# Patient Record
Sex: Male | Born: 1960 | Race: White | Hispanic: No | Marital: Married | Smoking: Never smoker
Health system: Southern US, Community
[De-identification: ages and names within clinical notes are randomized; demographics above are authoritative.]

## PROBLEM LIST (undated history)

## (undated) DIAGNOSIS — F4310 Post-traumatic stress disorder, unspecified: Secondary | ICD-10-CM

## (undated) DIAGNOSIS — E119 Type 2 diabetes mellitus without complications: Secondary | ICD-10-CM

## (undated) DIAGNOSIS — G4730 Sleep apnea, unspecified: Secondary | ICD-10-CM

## (undated) DIAGNOSIS — K635 Polyp of colon: Secondary | ICD-10-CM

## (undated) DIAGNOSIS — I10 Essential (primary) hypertension: Secondary | ICD-10-CM

## (undated) DIAGNOSIS — G8929 Other chronic pain: Secondary | ICD-10-CM

## (undated) DIAGNOSIS — R7303 Prediabetes: Secondary | ICD-10-CM

## (undated) DIAGNOSIS — R519 Headache, unspecified: Secondary | ICD-10-CM

## (undated) DIAGNOSIS — E7800 Pure hypercholesterolemia, unspecified: Secondary | ICD-10-CM

## (undated) DIAGNOSIS — F32A Depression, unspecified: Secondary | ICD-10-CM

## (undated) HISTORY — DX: Headache, unspecified: R51.9

## (undated) HISTORY — PX: LAPAROSCOPIC GASTRIC BANDING: SHX1100

## (undated) HISTORY — PX: CERVICAL FUSION: SHX112

## (undated) HISTORY — DX: Prediabetes: R73.03

## (undated) HISTORY — DX: Polyp of colon: K63.5

## (undated) HISTORY — PX: BACK SURGERY: SHX140

## (undated) HISTORY — DX: Post-traumatic stress disorder, unspecified: F43.10

## (undated) HISTORY — DX: Other chronic pain: G89.29

## (undated) HISTORY — PX: SHOULDER SURGERY: SHX246

---
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
## 2017-12-01 ENCOUNTER — Emergency Department
Admission: EM | Admit: 2017-12-01 | Discharge: 2017-12-01 | Disposition: A | Discharge status: Home / Self Care | Payer: Managed Care, Other (non HMO) | Attending: Emergency Medicine | Admitting: Emergency Medicine

## 2017-12-01 ENCOUNTER — Other Ambulatory Visit: Payer: Self-pay

## 2017-12-01 DIAGNOSIS — R509 Fever, unspecified: Secondary | ICD-10-CM | POA: Diagnosis not present

## 2017-12-01 DIAGNOSIS — N289 Disorder of kidney and ureter, unspecified: Secondary | ICD-10-CM | POA: Diagnosis not present

## 2017-12-01 DIAGNOSIS — I10 Essential (primary) hypertension: Secondary | ICD-10-CM | POA: Insufficient documentation

## 2017-12-01 DIAGNOSIS — R531 Weakness: Secondary | ICD-10-CM | POA: Diagnosis present

## 2017-12-01 HISTORY — DX: Pure hypercholesterolemia, unspecified: E78.00

## 2017-12-01 HISTORY — DX: Sleep apnea, unspecified: G47.30

## 2017-12-01 HISTORY — DX: Essential (primary) hypertension: I10

## 2017-12-01 LAB — URINALYSIS, COMPLETE (UACMP) WITH MICROSCOPIC
BACTERIA UA: NONE SEEN
Bilirubin Urine: NEGATIVE
Glucose, UA: NEGATIVE mg/dL
Ketones, ur: NEGATIVE mg/dL
LEUKOCYTES UA: NEGATIVE
Nitrite: NEGATIVE
PROTEIN: NEGATIVE mg/dL
SQUAMOUS EPITHELIAL / LPF: NONE SEEN (ref 0–5)
Specific Gravity, Urine: 1.013 (ref 1.005–1.030)
pH: 5 (ref 5.0–8.0)

## 2017-12-01 LAB — BASIC METABOLIC PANEL
Anion gap: 8 (ref 5–15)
BUN: 20 mg/dL (ref 6–20)
CO2: 26 mmol/L (ref 22–32)
Calcium: 9.3 mg/dL (ref 8.9–10.3)
Chloride: 101 mmol/L (ref 101–111)
Creatinine, Ser: 1.65 mg/dL — ABNORMAL HIGH (ref 0.61–1.24)
GFR calc Af Amer: 52 mL/min — ABNORMAL LOW (ref >60)
GFR calc non Af Amer: 45 mL/min — ABNORMAL LOW (ref >60)
GLUCOSE: 137 mg/dL — AB (ref 65–99)
POTASSIUM: 4.2 mmol/L (ref 3.5–5.1)
Sodium: 135 mmol/L (ref 135–145)

## 2017-12-01 LAB — CBC
HEMATOCRIT: 48.3 % (ref 40.0–52.0)
Hemoglobin: 16.6 g/dL (ref 13.0–18.0)
MCH: 28.9 pg (ref 26.0–34.0)
MCHC: 34.4 g/dL (ref 32.0–36.0)
MCV: 84.2 fL (ref 80.0–100.0)
Platelets: 118 10*3/uL — ABNORMAL LOW (ref 150–440)
RBC: 5.74 MIL/uL (ref 4.40–5.90)
RDW: 13.6 % (ref 11.5–14.5)
WBC: 4 10*3/uL (ref 3.8–10.6)

## 2017-12-01 LAB — TROPONIN I: Troponin I: <0.03 ng/mL (ref <0.03)

## 2017-12-01 MED ORDER — METHYLPREDNISOLONE SODIUM SUCC 125 MG IJ SOLR: 125.0000 mg | Freq: Once | INTRAMUSCULAR | Status: DC

## 2017-12-01 MED ORDER — SODIUM CHLORIDE 0.9 % IV BOLUS
1000.0000 mL | Freq: Once | INTRAVENOUS | Status: AC
Start: 2017-12-01 — End: 2017-12-01
  Administered 2017-12-01: 1000 mL via INTRAVENOUS

## 2017-12-01 NOTE — ED Notes (Signed)
Report from kate, rn.  

## 2017-12-01 NOTE — ED Provider Notes (Signed)
[facility] Emergency Department Provider Note  Time seen: 3:04 PM  I have reviewed the triage vital signs and the nursing notes.   HISTORY  Chief Complaint Hypotension    HPI Jesse Wyatt is a 57 y.o. male with a past medical history of hypertension, hyperlipidemia who presents to the emergency department for generalized weakness and subjective fever.  According to the patient for the past 1 week he has not been feeling well he reports generalized fatigue and weakness.  States last night he had a subjective fever and awoke sweating overnight.  Patient went to urgent care today for evaluation states he is also been experiencing a rash over his body very itchy consistent with poison oak/poison ivy.  Patient states a history of poison ivy/oak allergies.  She received a steroid injection at urgent care.  They state the patient had a low blood pressure as well.  They checked his blood pressure several times but it did not improve so they sent him to the emergency department for further evaluation.  Here the patient states generalized fatigue and weakness as his only symptom besides rash with itching.  States they have been doing a lot of yard work over the past 1 week and he has been bitten by several ticks as well.  Reports a subjective fever last night but did not measure his temperature.  Denies any nausea, vomiting, diarrhea, dysuria, hematuria, chest pain, cough/congestion or abdominal pain.  Largely negative review of systems.    Past Medical History:  Diagnosis Date  . High cholesterol   . Hypertension   . Sleep apnea     There are no active problems to display for this patient.   Past Surgical History:  Procedure Laterality Date  . LAPAROSCOPIC GASTRIC BANDING    . SHOULDER SURGERY      Prior to Admission medications   Not on File    Allergies  Allergen Reactions  . Poison Oak Extract     History reviewed. No pertinent family history.  Social  History Social History   Tobacco Use  . Smoking status: Never Smoker  Substance Use Topics  . Alcohol use: Never    Frequency: Never  . Drug use: Not on file    Review of Systems Constitutional: Subjective fever. Eyes: Negative for visual complaints ENT: Negative for recent illness/congestion Cardiovascular: Negative for chest pain. Respiratory: Negative for shortness of breath.  Negative for cough. Gastrointestinal: Negative for abdominal pain, vomiting and diarrhea. Genitourinary: Negative for urinary compaints Musculoskeletal: Negative for musculoskeletal complaints Skin: Negative for skin complaints  Neurological: Negative for headache All other ROS negative  ____________________________________________   PHYSICAL EXAM:  VITAL SIGNS: ED Triage Vitals  Enc Vitals Group     BP 12/01/17 1418 (!) 101/57     Pulse Rate 12/01/17 1418 68     Resp 12/01/17 1418 10     Temp 12/01/17 1418 99 F (37.2 C)     Temp Source 12/01/17 1418 Oral     SpO2 12/01/17 1418 100 %     Weight 12/01/17 1414 255 lb (115.7 kg)     Height 12/01/17 1414 5\' 10"  (1.778 m)     Head Circumference --      Peak Flow --      Pain Score 12/01/17 1414 0     Pain Loc --      Pain Edu? --      Excl. in [place]? --    Constitutional: Alert and oriented. Well  appearing and in no distress. Eyes: Normal exam ENT   Head: Normocephalic and atraumatic.   Mouth/Throat: Mucous membranes are moist. Cardiovascular: Normal rate, regular rhythm. No murmur Respiratory: Normal respiratory effort without tachypnea nor retractions. Breath sounds are clear Gastrointestinal: Soft and nontender. No distention.  Musculoskeletal: Nontender with normal range of motion in all extremities. No lower extremity tenderness or edema. Neurologic:  Normal speech and language. No gross focal neurologic deficits Skin:  Skin is warm, dry.  Patient does have a rash over his bilateral upper extremities minimally on the lower  extremities and somewhat on the abdomen and back consistent with poison ivy/poison oak.  Patient states the rash has been present for at least 1 week. Psychiatric: Mood and affect are normal.   ____________________________________________    EKG  EKG reviewed and interpreted by myself shows normal sinus rhythm at 69 bpm with a narrow QRS, normal axis, largely normal intervals, no concerning ST changes.  ____________________________________________   INITIAL IMPRESSION / ASSESSMENT AND PLAN / ED COURSE  Pertinent labs & imaging results that were available during my care of the patient were reviewed by me and considered in my medical decision making (see chart for details).  Patient presents to the emergency department for generalized fatigue weakness, rash consistent with poison ivy/oak and hypotension.  Differential is quite broad would include infectious etiology, metabolic/electrolyte abnormality, dehydration, tickborne illness.  We will check labs, IV hydrate.  Patient received IM steroids prior to emergency department.  Overall the patient appears well, blood pressure currently 96/58.  Clear lung sounds, nontender abdomen, patient does have a rash which does appear consistent with poison ivy/oak.  Does not appear consistent with tickborne illness.  Labs are largely within normal limits besides creatinine 1.65.  Patient denies any renal insufficiency at baseline but he has no old records for comparison.  Patient follows up at the [place].  Overall patient appears well blood pressure is improved currently 397 systolic.  Patient denies feeling weak or dizzy.  States he is feeling normal at this time.  I discussed with the patient significant oral hydration over the next several days and following up on Tuesday with his doctor at the [facility] for recheck of his kidney function.  Patient agreeable to this plan of care.    ____________________________________________   FINAL CLINICAL IMPRESSION(S) / ED  DIAGNOSES  Weakness Hypertension Fever Renal insufficiency   Harvest Dark, MD 12/01/17 [phone]

## 2017-12-01 NOTE — ED Triage Notes (Signed)
Pt arrives [facility] (from home though). States he has been working outside a lot recently, has poison oak to bilat arms and lower abd. Hasn't taken anything for it, [facility] gave a steroid shot. Pt has been c/o of dizziness and lightheadedness, states ok at this time. Denies pain. BP with EMS was 80/56. 95% on RA, HR 72. No distress noted upon arrival. Ambulatory from EMS stretcher to ED stretcher. Alert and oriented, speaking in complete sentences.

## 2017-12-01 NOTE — Discharge Instructions (Addendum)
As we discussed please drink plenty of fluids over the next several days.  Please follow-up with your doctor in 2 to 3 days for recheck of your kidney function.  Today's creatinine is 1.6.  Return to the emergency department if you feel dizzy weak, lightheaded or if you pass out.

## 2017-12-01 NOTE — ED Notes (Signed)
Pt states that his wife has been getting 4-5 ticks off each day once he is done working outside. States that none are engorged.

## 2018-12-20 ENCOUNTER — Encounter: Payer: Self-pay | Admitting: Internal Medicine

## 2018-12-20 ENCOUNTER — Ambulatory Visit: Payer: Managed Care, Other (non HMO) | Admitting: Internal Medicine

## 2018-12-20 ENCOUNTER — Other Ambulatory Visit: Payer: Self-pay

## 2018-12-20 DIAGNOSIS — K635 Polyp of colon: Secondary | ICD-10-CM | POA: Insufficient documentation

## 2018-12-20 DIAGNOSIS — F4310 Post-traumatic stress disorder, unspecified: Secondary | ICD-10-CM

## 2018-12-20 DIAGNOSIS — D126 Benign neoplasm of colon, unspecified: Secondary | ICD-10-CM

## 2018-12-20 DIAGNOSIS — I10 Essential (primary) hypertension: Secondary | ICD-10-CM | POA: Diagnosis not present

## 2018-12-20 DIAGNOSIS — G8929 Other chronic pain: Secondary | ICD-10-CM | POA: Insufficient documentation

## 2018-12-20 DIAGNOSIS — M549 Dorsalgia, unspecified: Secondary | ICD-10-CM

## 2018-12-20 DIAGNOSIS — E7800 Pure hypercholesterolemia, unspecified: Secondary | ICD-10-CM

## 2018-12-20 DIAGNOSIS — Z0000 Encounter for general adult medical examination without abnormal findings: Secondary | ICD-10-CM

## 2018-12-20 DIAGNOSIS — G4733 Obstructive sleep apnea (adult) (pediatric): Secondary | ICD-10-CM

## 2018-12-20 DIAGNOSIS — R7303 Prediabetes: Secondary | ICD-10-CM | POA: Insufficient documentation

## 2018-12-20 DIAGNOSIS — G4730 Sleep apnea, unspecified: Secondary | ICD-10-CM | POA: Insufficient documentation

## 2018-12-20 NOTE — Assessment & Plan Note (Signed)
Sleeps with CPAP nightly

## 2018-12-20 NOTE — Progress Notes (Signed)
Subjective:    Patient ID: Jesse Wyatt, male    DOB: 10/06/1960, 58 y.o.   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 Same run every week---[place] twice a week  HTN started after injury in [place] About 7 years ago Has done fine with the medication Blood work done at [place]  Also diagnosed with prediabetes A1c 6.6% from the [place] Also had hematuria on DOT urinalysis----is getting evaluation at the [facility] for this next week  PTSD--on escitalopram No hospitalization No longer seeing psychiatrist or counselor now No anger issues Mostly depression--but not persistent Not anhedonic  Current Outpatient Medications on File Prior to Visit  Medication Sig Dispense Refill   atorvastatin (LIPITOR) 20 MG tablet Take 20 mg by mouth at bedtime.     escitalopram (LEXAPRO) 20 MG tablet Take 20 mg by mouth daily.     ibuprofen (ADVIL,MOTRIN) 400 MG tablet Take 400 mg by mouth 3 (three) times daily as needed for headache or moderate pain.     lisinopril-hydrochlorothiazide (PRINZIDE,ZESTORETIC) 20-12.5 MG tablet Take 1 tablet by mouth daily.     Omega-3 1000 MG CAPS Take 1 capsule by mouth 2 (two) times daily.     sildenafil (VIAGRA) 100 MG tablet Take 100 mg by mouth daily as needed for erectile dysfunction.     No current facility-administered medications on file prior to visit.     Allergies  Allergen Reactions   Poison Oak Extract     Past Medical History:  Diagnosis Date   Chronic back pain    Chronic headaches    does have photosensitivity. Ibuprofen helps   Colon polyps    colonoscopy every 5 years   High cholesterol    Hypertension    PTSD (post-traumatic stress disorder)    Sleep apnea     Past Surgical  History:  Procedure Laterality Date   CERVICAL FUSION  ~2000   LAPAROSCOPIC GASTRIC BANDING  ~2010   didn't help   SHOULDER SURGERY      Family History  Problem Relation Age of Onset   Stroke Mother    Hypertension Mother    Diabetes Mother    COPD Father    Atrial fibrillation Father    Prostate cancer Brother     Social History   Socioeconomic History   Marital status: Married    Spouse name: Not on file   Number of children: 1   Years of education: Not on file   Highest education level: Not on file  Occupational History   Occupation: Truck driver    Comment: long distance  Scientist, product/process development strain: Not on file   Food insecurity    Worry: Not on file    Inability: Not on file   Transportation needs    Medical: Not on file    Non-medical: Not on file  Tobacco Use   Smoking status: Never Smoker   Smokeless tobacco: Never Used  Substance and Sexual Activity   Alcohol use: Never    Frequency: Never   Drug use: Not on file   Sexual activity: Not on file  Lifestyle   Physical activity    Days per week: Not on file    Minutes per session: Not on file  Stress: Not on file  Relationships   Social connections    Talks on phone: Not on file    Gets together: Not on file    Attends religious service: Not on file    Active member of club or organization: Not on file    Attends meetings of clubs or organizations: Not on file    Relationship status: Not on file   Intimate partner violence    Fear of current or ex partner: Not on file    Emotionally abused: Not on file    Physically abused: Not on file    Forced sexual activity: Not on file  Other Topics Concern   Not on file  Social History Narrative   1 son   Review of Systems  Constitutional: Negative for unexpected weight change.       Hasn't been able to exercise due to back pain Wears seat belt  HENT: Negative for dental problem, hearing loss and tinnitus.    Eyes: Negative for visual disturbance.       Acuity has gone down  Respiratory: Negative for cough, chest tightness and shortness of breath.   Cardiovascular: Negative for chest pain, palpitations and leg swelling.  Gastrointestinal: Negative for blood in stool and constipation.       No heartburn  Endocrine: Negative for polydipsia and polyuria.  Genitourinary:       Nocturia x 2 Libido is down  Musculoskeletal: Positive for back pain. Negative for joint swelling.       Back pain into hips and upper legs  Allergic/Immunologic: Negative for environmental allergies and immunocompromised state.  Neurological: Positive for headaches. Negative for dizziness, syncope and light-headedness.  Hematological: Negative for adenopathy. Does not bruise/bleed easily.  Psychiatric/Behavioral: Positive for dysphoric mood. Negative for sleep disturbance. The patient is nervous/anxious.        Restless legs --better after sleep       Objective:   Physical Exam  Constitutional: He appears well-developed. No distress.  HENT:  Head: Normocephalic and atraumatic.  Right Ear: External ear normal.  Left Ear: External ear normal.  Mouth/Throat: Oropharynx is clear and moist. No oropharyngeal exudate.  Eyes: Pupils are equal, round, and reactive to light. Conjunctivae are normal.  Cardiovascular: Normal rate, regular rhythm, normal heart sounds and intact distal pulses. Exam reveals no gallop.  No murmur heard. Respiratory: Effort normal and breath sounds normal. No respiratory distress. He has no wheezes. He has no rales.  GI: Soft. There is no abdominal tenderness.  Band port palpable in LUQ  Musculoskeletal:        General: No tenderness or edema.  Lymphadenopathy:    He has no cervical adenopathy.  Skin: No rash noted. No erythema.  Psychiatric: He has a normal mood and affect. His behavior is normal.           Assessment & Plan:

## 2018-12-20 NOTE — Assessment & Plan Note (Signed)
BP Readings from Last 3 Encounters:  12/20/18 110/70  12/01/17 108/63   Good control Labs at [facility]

## 2018-12-20 NOTE — Assessment & Plan Note (Signed)
Colon due soon at the [place] Not sure if he has had PSA---I suspect so He will check on Td Flu vaccine in the fall Discussed fitness

## 2018-12-20 NOTE — Assessment & Plan Note (Signed)
Doing okay now on the medication

## 2018-12-20 NOTE — Patient Instructions (Signed)
DASH Eating Plan  DASH stands for "Dietary Approaches to Stop Hypertension." The DASH eating plan is a healthy eating plan that has been shown to reduce high blood pressure (hypertension). It may also reduce your risk for type 2 diabetes, heart disease, and stroke. The DASH eating plan may also help with weight loss.  What are tips for following this plan?    General guidelines   Avoid eating more than 2,300 mg (milligrams) of salt (sodium) a day. If you have hypertension, you may need to reduce your sodium intake to 1,500 mg a day.   Limit alcohol intake to no more than 1 drink a day for nonpregnant women and 2 drinks a day for men. One drink equals 12 oz of beer, 5 oz of wine, or 1 oz of hard liquor.   Work with your health care provider to maintain a healthy body weight or to lose weight. Ask what an ideal weight is for you.   Get at least 30 minutes of exercise that causes your heart to beat faster (aerobic exercise) most days of the week. Activities may include walking, swimming, or biking.   Work with your health care provider or diet and nutrition specialist (dietitian) to adjust your eating plan to your individual calorie needs.  Reading food labels     Check food labels for the amount of sodium per serving. Choose foods with less than 5 percent of the Daily Value of sodium. Generally, foods with less than 300 mg of sodium per serving fit into this eating plan.   To find whole grains, look for the word "whole" as the first word in the ingredient list.  Shopping   Buy products labeled as "low-sodium" or "no salt added."   Buy fresh foods. Avoid canned foods and premade or frozen meals.  Cooking   Avoid adding salt when cooking. Use salt-free seasonings or herbs instead of table salt or sea salt. Check with your health care provider or pharmacist before using salt substitutes.   Do not fry foods. Cook foods using healthy methods such as baking, boiling, grilling, and broiling instead.   Cook with  heart-healthy oils, such as olive, canola, soybean, or sunflower oil.  Meal planning   Eat a balanced diet that includes:  ? 5 or more servings of fruits and vegetables each day. At each meal, try to fill half of your plate with fruits and vegetables.  ? Up to 6-8 servings of whole grains each day.  ? Less than 6 oz of lean meat, poultry, or fish each day. A 3-oz serving of meat is about the same size as a deck of cards. One egg equals 1 oz.  ? 2 servings of low-fat dairy each day.  ? A serving of nuts, seeds, or beans 5 times each week.  ? Heart-healthy fats. Healthy fats called Omega-3 fatty acids are found in foods such as flaxseeds and coldwater fish, like sardines, salmon, and mackerel.   Limit how much you eat of the following:  ? Canned or prepackaged foods.  ? Food that is high in trans fat, such as fried foods.  ? Food that is high in saturated fat, such as fatty meat.  ? Sweets, desserts, sugary drinks, and other foods with added sugar.  ? Full-fat dairy products.   Do not salt foods before eating.   Try to eat at least 2 vegetarian meals each week.   Eat more home-cooked food and less restaurant, buffet, and fast food.     When eating at a restaurant, ask that your food be prepared with less salt or no salt, if possible.  What foods are recommended?  The items listed may not be a complete list. Talk with your dietitian about what dietary choices are best for you.  Grains  Whole-grain or whole-wheat bread. Whole-grain or whole-wheat pasta. Brown rice. Oatmeal. Quinoa. Bulgur. Whole-grain and low-sodium cereals. Pita bread. Low-fat, low-sodium crackers. Whole-wheat flour tortillas.  Vegetables  Fresh or frozen vegetables (raw, steamed, roasted, or grilled). Low-sodium or reduced-sodium tomato and vegetable juice. Low-sodium or reduced-sodium tomato sauce and tomato paste. Low-sodium or reduced-sodium canned vegetables.  Fruits  All fresh, dried, or frozen fruit. Canned fruit in natural juice (without  added sugar).  Meat and other protein foods  Skinless chicken or turkey. Ground chicken or turkey. Pork with fat trimmed off. Fish and seafood. Egg whites. Dried beans, peas, or lentils. Unsalted nuts, nut butters, and seeds. Unsalted canned beans. Lean cuts of beef with fat trimmed off. Low-sodium, lean deli meat.  Dairy  Low-fat (1%) or fat-free (skim) milk. Fat-free, low-fat, or reduced-fat cheeses. Nonfat, low-sodium ricotta or cottage cheese. Low-fat or nonfat yogurt. Low-fat, low-sodium cheese.  Fats and oils  Soft margarine without trans fats. Vegetable oil. Low-fat, reduced-fat, or light mayonnaise and salad dressings (reduced-sodium). Canola, safflower, olive, soybean, and sunflower oils. Avocado.  Seasoning and other foods  Herbs. Spices. Seasoning mixes without salt. Unsalted popcorn and pretzels. Fat-free sweets.  What foods are not recommended?  The items listed may not be a complete list. Talk with your dietitian about what dietary choices are best for you.  Grains  Baked goods made with fat, such as croissants, muffins, or some breads. Dry pasta or rice meal packs.  Vegetables  Creamed or fried vegetables. Vegetables in a cheese sauce. Regular canned vegetables (not low-sodium or reduced-sodium). Regular canned tomato sauce and paste (not low-sodium or reduced-sodium). Regular tomato and vegetable juice (not low-sodium or reduced-sodium). Pickles. Olives.  Fruits  Canned fruit in a light or heavy syrup. Fried fruit. Fruit in cream or butter sauce.  Meat and other protein foods  Fatty cuts of meat. Ribs. Fried meat. Bacon. Sausage. Bologna and other processed lunch meats. Salami. Fatback. Hotdogs. Bratwurst. Salted nuts and seeds. Canned beans with added salt. Canned or smoked fish. Whole eggs or egg yolks. Chicken or turkey with skin.  Dairy  Whole or 2% milk, cream, and half-and-half. Whole or full-fat cream cheese. Whole-fat or sweetened yogurt. Full-fat cheese. Nondairy creamers. Whipped toppings.  Processed cheese and cheese spreads.  Fats and oils  Butter. Stick margarine. Lard. Shortening. Ghee. Bacon fat. Tropical oils, such as coconut, palm kernel, or palm oil.  Seasoning and other foods  Salted popcorn and pretzels. Onion salt, garlic salt, seasoned salt, table salt, and sea salt. Worcestershire sauce. Tartar sauce. Barbecue sauce. Teriyaki sauce. Soy sauce, including reduced-sodium. Steak sauce. Canned and packaged gravies. Fish sauce. Oyster sauce. Cocktail sauce. Horseradish that you find on the shelf. Ketchup. Mustard. Meat flavorings and tenderizers. Bouillon cubes. Hot sauce and Tabasco sauce. Premade or packaged marinades. Premade or packaged taco seasonings. Relishes. Regular salad dressings.  Where to find more information:   National Heart, Lung, and Blood Institute: www.nhlbi.nih.gov   American Heart Association: www.heart.org  Summary   The DASH eating plan is a healthy eating plan that has been shown to reduce high blood pressure (hypertension). It may also reduce your risk for type 2 diabetes, heart disease, and stroke.   With the   DASH eating plan, you should limit salt (sodium) intake to 2,300 mg a day. If you have hypertension, you may need to reduce your sodium intake to 1,500 mg a day.   When on the DASH eating plan, aim to eat more fresh fruits and vegetables, whole grains, lean proteins, low-fat dairy, and heart-healthy fats.   Work with your health care provider or diet and nutrition specialist (dietitian) to adjust your eating plan to your individual calorie needs.  This information is not intended to replace advice given to you by your health care provider. Make sure you discuss any questions you have with your health care provider.  Document Released: 06/15/2011 Document Revised: 06/19/2016 Document Reviewed: 06/19/2016  [employer]vier Interactive Patient Education  2019 [employer]

## 2019-11-13 ENCOUNTER — Other Ambulatory Visit: Payer: Self-pay | Admitting: Neurosurgery

## 2019-11-27 ENCOUNTER — Encounter (HOSPITAL_COMMUNITY): Payer: Self-pay

## 2019-11-27 NOTE — Progress Notes (Signed)
[place], [place] - [place] [phone] [place] [address] Phone: [phone] Fax: [phone]      Your procedure is scheduled on Dec 01, 2019.  Report to [facility] Main Entrance "A" at 06:30 A.M., and check in at the Admitting office.  Call this number if you have problems the morning of surgery:  [phone]  Call [phone] if you have any questions prior to your surgery date Monday-Friday 8am-4pm    Remember:  Do not eat or drink after midnight the night before your surgery    Take these medicines the morning of surgery with A SIP OF WATER : Excitalopram (Lexapro)  As of today, STOP taking any Aspirin (unless otherwise instructed by your surgeon) and Aspirin containing products, Aleve, Naproxen, Ibuprofen, Motrin, Advil, Goody's, BC's, all herbal medications, fish oil, and all vitamins.   WHAT DO I DO ABOUT MY DIABETES MEDICATION?   Marland Kitchen Do not take oral diabetes medicines (pills) the morning of surgery. DO NOT take Metformin (Glucophage) the morning of surgery.   HOW TO MANAGE YOUR DIABETES BEFORE AND AFTER SURGERY  Why is it important to control my blood sugar before and after surgery? . Improving blood sugar levels before and after surgery helps healing and can limit problems. . A way of improving blood sugar control is eating a healthy diet by: o  Eating less sugar and carbohydrates o  Increasing activity/exercise o  Talking with your doctor about reaching your blood sugar goals . High blood sugars (greater than 180 mg/dL) can raise your risk of infections and slow your recovery, so you will need to focus on controlling your diabetes during the weeks before surgery. . Make sure that the doctor who takes care of your diabetes knows about your planned surgery including the date and location.  How do I manage my blood sugar before surgery? . Check your blood sugar at least 4 times a  day, starting 2 days before surgery, to make sure that the level is not too high or low. . Check your blood sugar the morning of your surgery when you wake up and every 2 hours until you get to the Short Stay unit. o If your blood sugar is less than 70 mg/dL, you will need to treat for low blood sugar: - Do not take insulin. - Treat a low blood sugar (less than 70 mg/dL) with  cup of clear juice (cranberry or apple), 4 glucose tablets, OR glucose gel. - Recheck blood sugar in 15 minutes after treatment (to make sure it is greater than 70 mg/dL). If your blood sugar is not greater than 70 mg/dL on recheck, call [phone] for further instructions. . Report your blood sugar to the short stay nurse when you get to Short Stay.  . If you are admitted to the hospital after surgery: o Your blood sugar will be checked by the staff and you will probably be given insulin after surgery (instead of oral diabetes medicines) to make sure you have good blood sugar levels. o The goal for blood sugar control after surgery is 80-180 mg/dL.                      Do not wear jewelry.            Do not wear lotions, powders, perfumes/colognes, or deodorant.            Do not shave 48 hours prior to surgery.  Men may shave face and neck.            Do not bring valuables to the hospital.            [facility] is not responsible for any belongings or valuables.  Do NOT Smoke (Tobacco/Vapping) or drink Alcohol 24 hours prior to your procedure If you use a CPAP at night, you may bring all equipment for your overnight stay.   Contacts, glasses, dentures or bridgework may not be worn into surgery.      For patients admitted to the hospital, discharge time will be determined by your treatment team.   Patients discharged the day of surgery will not be allowed to drive home, and someone needs to stay with them for 24 hours.    Special instructions:   [facility]- Preparing For Surgery  Before surgery, you can  play an important role. Because skin is not sterile, your skin needs to be as free of germs as possible. You can reduce the number of germs on your skin by washing with CHG (chlorahexidine gluconate) Soap before surgery.  CHG is an antiseptic cleaner which kills germs and bonds with the skin to continue killing germs even after washing.    Oral Hygiene is also important to reduce your risk of infection.  Remember - BRUSH YOUR TEETH THE MORNING OF SURGERY WITH YOUR REGULAR TOOTHPASTE  Please do not use if you have an allergy to CHG or antibacterial soaps. If your skin becomes reddened/irritated stop using the CHG.  Do not shave (including legs and underarms) for at least 48 hours prior to first CHG shower. It is OK to shave your face.  Please follow these instructions carefully.   1. Shower the NIGHT BEFORE SURGERY and the MORNING OF SURGERY with CHG Soap.   2. If you chose to wash your hair, wash your hair first as usual with your normal shampoo.  3. After you shampoo, rinse your hair and body thoroughly to remove the shampoo.  4. Use CHG as you would any other liquid soap. You can apply CHG directly to the skin and wash gently with a scrungie or a clean washcloth.   5. Apply the CHG Soap to your body ONLY FROM THE NECK DOWN.  Do not use on open wounds or open sores. Avoid contact with your eyes, ears, mouth and genitals (private parts). Wash Face and genitals (private parts)  with your normal soap.   6. Wash thoroughly, paying special attention to the area where your surgery will be performed.  7. Thoroughly rinse your body with warm water from the neck down.  8. DO NOT shower/wash with your normal soap after using and rinsing off the CHG Soap.  9. Pat yourself dry with a CLEAN TOWEL.  10. Wear CLEAN PAJAMAS to bed the night before surgery, wear comfortable clothes the morning of surgery  11. Place CLEAN SHEETS on your bed the night of your first shower and DO NOT SLEEP WITH  PETS.   Day of Surgery:   Do not apply any deodorants/lotions.  Please wear clean clothes to the hospital/surgery center.   Remember to brush your teeth WITH YOUR REGULAR TOOTHPASTE.   Please read over the following fact sheets that you were given.

## 2019-11-28 ENCOUNTER — Encounter (HOSPITAL_COMMUNITY): Payer: Self-pay

## 2019-11-28 ENCOUNTER — Other Ambulatory Visit (HOSPITAL_COMMUNITY)
Admission: RE | Admit: 2019-11-28 | Discharge: 2019-11-28 | Disposition: A | Discharge status: Home / Self Care | Payer: No Typology Code available for payment source | Source: Ambulatory Visit | Attending: Neurosurgery | Admitting: Neurosurgery

## 2019-11-28 ENCOUNTER — Other Ambulatory Visit: Payer: Self-pay

## 2019-11-28 ENCOUNTER — Encounter (HOSPITAL_COMMUNITY)
Admission: RE | Admit: 2019-11-28 | Discharge: 2019-11-28 | Disposition: A | Discharge status: Home / Self Care | Payer: No Typology Code available for payment source | Source: Ambulatory Visit | Attending: Neurosurgery | Admitting: Neurosurgery

## 2019-11-28 DIAGNOSIS — M48061 Spinal stenosis, lumbar region without neurogenic claudication: Secondary | ICD-10-CM | POA: Insufficient documentation

## 2019-11-28 DIAGNOSIS — E119 Type 2 diabetes mellitus without complications: Secondary | ICD-10-CM | POA: Insufficient documentation

## 2019-11-28 DIAGNOSIS — Z7984 Long term (current) use of oral hypoglycemic drugs: Secondary | ICD-10-CM | POA: Insufficient documentation

## 2019-11-28 DIAGNOSIS — I4510 Unspecified right bundle-branch block: Secondary | ICD-10-CM | POA: Insufficient documentation

## 2019-11-28 DIAGNOSIS — I10 Essential (primary) hypertension: Secondary | ICD-10-CM | POA: Insufficient documentation

## 2019-11-28 DIAGNOSIS — G4730 Sleep apnea, unspecified: Secondary | ICD-10-CM | POA: Insufficient documentation

## 2019-11-28 DIAGNOSIS — Z01818 Encounter for other preprocedural examination: Secondary | ICD-10-CM | POA: Insufficient documentation

## 2019-11-28 DIAGNOSIS — Z20822 Contact with and (suspected) exposure to covid-19: Secondary | ICD-10-CM | POA: Insufficient documentation

## 2019-11-28 HISTORY — DX: Type 2 diabetes mellitus without complications: E11.9

## 2019-11-28 LAB — CBC
HCT: 46.6 % (ref 39.0–52.0)
Hemoglobin: 15.5 g/dL (ref 13.0–17.0)
MCH: 28.5 pg (ref 26.0–34.0)
MCHC: 33.3 g/dL (ref 30.0–36.0)
MCV: 85.8 fL (ref 80.0–100.0)
Platelets: 210 10*3/uL (ref 150–400)
RBC: 5.43 MIL/uL (ref 4.22–5.81)
RDW: 12.8 % (ref 11.5–15.5)
WBC: 7.8 10*3/uL (ref 4.0–10.5)
nRBC: 0 % (ref 0.0–0.2)

## 2019-11-28 LAB — BASIC METABOLIC PANEL
Anion gap: 10 (ref 5–15)
BUN: 17 mg/dL (ref 6–20)
CO2: 24 mmol/L (ref 22–32)
Calcium: 9.3 mg/dL (ref 8.9–10.3)
Chloride: 104 mmol/L (ref 98–111)
Creatinine, Ser: 1.05 mg/dL (ref 0.61–1.24)
GFR calc Af Amer: >60 mL/min (ref >60)
GFR calc non Af Amer: >60 mL/min (ref >60)
Glucose, Bld: 227 mg/dL — ABNORMAL HIGH (ref 70–99)
Potassium: 4.1 mmol/L (ref 3.5–5.1)
Sodium: 138 mmol/L (ref 135–145)

## 2019-11-28 LAB — SURGICAL PCR SCREEN
MRSA, PCR: NEGATIVE
Staphylococcus aureus: POSITIVE — AB

## 2019-11-28 LAB — TYPE AND SCREEN
ABO/RH(D): A POS
Antibody Screen: NEGATIVE

## 2019-11-28 LAB — GLUCOSE, CAPILLARY: Glucose-Capillary: 248 mg/dL — ABNORMAL HIGH (ref 70–99)

## 2019-11-28 LAB — HEMOGLOBIN A1C
Hgb A1c MFr Bld: 6.8 % — ABNORMAL HIGH (ref 4.8–5.6)
Mean Plasma Glucose: 148.46 mg/dL

## 2019-11-28 LAB — SARS CORONAVIRUS 2 (TAT 6-24 HRS): SARS Coronavirus 2: NEGATIVE

## 2019-11-28 LAB — ABO/RH: ABO/RH(D): A POS

## 2019-11-28 NOTE — Progress Notes (Signed)
Anesthesia Chart Review:  Case: [id number] Date/Time: 12/01/19 0815   Procedure: Lumbar 3-4 Lumbar 4-5 Posterior lumbar interbody fusion (N/A )   Anesthesia type: General   Pre-op diagnosis: Spinal stenosis, lumbar region   Location: [facility] OR ROOM 20 / [facility]   Surgeons: Ashok Pall, MD      DISCUSSION: Patient is a 59 year old male scheduled for the above procedure.  History includes never smoker, OSA, hypercholesterolemia, HTN, PTSD, chronic back pain, DM2, laparoscopic gastric banding (~ 2010), cervical fusion (~ 2000). BMI is consistent with morbid obesity.  11/28/19 presurgical COVID-19 test is in process.  Anesthesia team to evaluate on the day of surgery.   VS: BP 140/70   Pulse 68   Temp 37.1 C (Oral)   Resp 18   Ht 5\' 6"  (1.676 m)   Wt 120.6 kg   SpO2 98%   BMI 42.90 kg/m   PROVIDERS: Venia Carbon, MD is listed as PCP.  He reported also seen Dr. Theda Sers at the [facility].   LABS: Labs reviewed: Acceptable for surgery. (all labs ordered are listed, but only abnormal results are displayed)  Labs Reviewed  SURGICAL PCR SCREEN - Abnormal; Notable for the following components:      Result Value   Staphylococcus aureus POSITIVE (*)    All other components within normal limits  GLUCOSE, CAPILLARY - Abnormal; Notable for the following components:   Glucose-Capillary 248 (*)    All other components within normal limits  BASIC METABOLIC PANEL - Abnormal; Notable for the following components:   Glucose, Bld 227 (*)    All other components within normal limits  HEMOGLOBIN A1C - Abnormal; Notable for the following components:   Hgb A1c MFr Bld 6.8 (*)    All other components within normal limits  CBC  TYPE AND SCREEN    EKG: 11/28/19: Normal sinus rhythm Incomplete right bundle branch block Borderline ECG No old tracing to compare Confirmed by Jenkins Rouge [phone]) on 11/28/2019 12:27:25 PM  CV: N/A  Past Medical History:  Diagnosis Date  . Chronic back  pain   . Chronic headaches    does have photosensitivity. Ibuprofen helps  . Colon polyps    colonoscopy every 5 years  . Diabetes mellitus without complication ([place])   . High cholesterol   . Hypertension   . Pre-diabetes   . Prediabetes   . PTSD (post-traumatic stress disorder)   . Sleep apnea     Past Surgical History:  Procedure Laterality Date  . CERVICAL FUSION  ~2000  . LAPAROSCOPIC GASTRIC BANDING  ~2010   didn't help  . SHOULDER SURGERY      MEDICATIONS: . atorvastatin (LIPITOR) 20 MG tablet  . cholecalciferol (VITAMIN D3) 25 MCG (1000 UNIT) tablet  . escitalopram (LEXAPRO) 20 MG tablet  . ibuprofen (ADVIL,MOTRIN) 400 MG tablet  . lisinopril-hydrochlorothiazide (PRINZIDE,ZESTORETIC) 20-12.5 MG tablet  . metFORMIN (GLUCOPHAGE) 500 MG tablet  . Omega-3 1000 MG CAPS  . sildenafil (VIAGRA) 100 MG tablet   No current facility-administered medications for this encounter.    Myra Gianotti, PA-C Surgical Short Stay/Anesthesiology [facility] Phone [phone] [facility] Phone [phone] 11/28/2019 3:20 PM

## 2019-11-28 NOTE — Progress Notes (Addendum)
PCP:  Dr. Theda Sers, [place] in [place] Cardiologist:  Denies  EKG:  11/28/19 CXR:  N/A ECHO:  Denies Stress Test:  Denies Cardiac Cath:  denies  Fasting Blood Sugar-  Patient does not have a machine to check BG's.  His BG at PAT was 248.  Checks Blood Sugar__0_ times a day  Anesthesia Review:  Yes, borderline EKG.    Covid test 11/28/19  Patient denies shortness of breath, fever, cough, and chest pain at PAT appointment.  Patient verbalized understanding of instructions provided today at the PAT appointment.  Patient asked to review instructions at home and day of surgery.

## 2019-11-30 ENCOUNTER — Encounter (HOSPITAL_COMMUNITY): Payer: Self-pay | Admitting: Neurosurgery

## 2019-11-30 NOTE — Anesthesia Preprocedure Evaluation (Addendum)
Anesthesia Evaluation  Patient identified by MRN, date of birth, ID band Patient awake    Reviewed: Allergy & Precautions, NPO status , Patient's Chart, lab work & pertinent test results  History of Anesthesia Complications (+) DIFFICULT AIRWAY and history of anesthetic complications  Airway Mallampati: III  TM Distance: >3 FB Neck ROM: Limited    Dental no notable dental hx. (+) Teeth Intact, Caps   Pulmonary sleep apnea and Continuous Positive Airway Pressure Ventilation ,    Pulmonary exam normal breath sounds clear to auscultation       Cardiovascular hypertension, Pt. on medications Normal cardiovascular exam Rhythm:Regular Rate:Normal  EKG- NSR, incomplete RBBB pattern   Neuro/Psych  Headaches, PSYCHIATRIC DISORDERS Anxiety PTSD   GI/Hepatic negative GI ROS, Neg liver ROS,   Endo/Other  diabetes, Well Controlled, Type 2, Oral Hypoglycemic AgentsMorbid obesityHyperlipidemia  Renal/GU negative Renal ROS  negative genitourinary   Musculoskeletal Spinal stenosis- L3-5 Chronic LBP   Abdominal (+) + obese,   Peds  Hematology negative hematology ROS (+)   Anesthesia Other Findings   Reproductive/Obstetrics                            Anesthesia Physical Anesthesia Plan  ASA: III  Anesthesia Plan: General   Post-op Pain Management:    Induction: Intravenous  PONV Risk Score and Plan: 3 and Midazolam, Ondansetron, Dexamethasone and Treatment may vary due to age or medical condition  Airway Management Planned: Oral ETT and Video Laryngoscope Planned  Additional Equipment:   Intra-op Plan:   Post-operative Plan: Extubation in OR  Informed Consent: I have reviewed the patients History and Physical, chart, labs and discussed the procedure including the risks, benefits and alternatives for the proposed anesthesia with the patient or authorized representative who has indicated his/her  understanding and acceptance.     Dental advisory given  Plan Discussed with: CRNA and Surgeon  Anesthesia Plan Comments: (Hx/o difficult intubation years ago. No difficulty with recent back surgery ( Nov. 2020 ). Limited ROM cervical spine.)       Anesthesia Quick Evaluation

## 2019-12-01 ENCOUNTER — Encounter (HOSPITAL_COMMUNITY): Payer: Self-pay | Admitting: Neurosurgery

## 2019-12-01 ENCOUNTER — Inpatient Hospital Stay (HOSPITAL_COMMUNITY): Payer: No Typology Code available for payment source | Admitting: Vascular Surgery

## 2019-12-01 ENCOUNTER — Inpatient Hospital Stay (HOSPITAL_COMMUNITY)
Admission: RE | Admit: 2019-12-01 | Discharge: 2019-12-03 | DRG: 454 | Disposition: A | Discharge status: Home / Self Care | Payer: No Typology Code available for payment source | Attending: Neurosurgery | Admitting: Neurosurgery

## 2019-12-01 ENCOUNTER — Encounter (HOSPITAL_COMMUNITY)
Admission: RE | Disposition: A | Discharge status: Home / Self Care | Payer: Self-pay | Source: Home / Self Care | Attending: Neurosurgery

## 2019-12-01 ENCOUNTER — Inpatient Hospital Stay (HOSPITAL_COMMUNITY): Payer: No Typology Code available for payment source | Admitting: Anesthesiology

## 2019-12-01 ENCOUNTER — Other Ambulatory Visit: Payer: Self-pay

## 2019-12-01 ENCOUNTER — Inpatient Hospital Stay (HOSPITAL_COMMUNITY): Payer: No Typology Code available for payment source

## 2019-12-01 DIAGNOSIS — G9741 Accidental puncture or laceration of dura during a procedure: Secondary | ICD-10-CM | POA: Diagnosis not present

## 2019-12-01 DIAGNOSIS — M4310 Spondylolisthesis, site unspecified: Secondary | ICD-10-CM | POA: Diagnosis present

## 2019-12-01 DIAGNOSIS — M4316 Spondylolisthesis, lumbar region: Secondary | ICD-10-CM | POA: Diagnosis present

## 2019-12-01 DIAGNOSIS — E119 Type 2 diabetes mellitus without complications: Secondary | ICD-10-CM | POA: Diagnosis present

## 2019-12-01 DIAGNOSIS — M48061 Spinal stenosis, lumbar region without neurogenic claudication: Secondary | ICD-10-CM | POA: Diagnosis present

## 2019-12-01 DIAGNOSIS — I10 Essential (primary) hypertension: Secondary | ICD-10-CM | POA: Diagnosis present

## 2019-12-01 DIAGNOSIS — Z419 Encounter for procedure for purposes other than remedying health state, unspecified: Secondary | ICD-10-CM

## 2019-12-01 DIAGNOSIS — Z20822 Contact with and (suspected) exposure to covid-19: Secondary | ICD-10-CM | POA: Diagnosis present

## 2019-12-01 LAB — GLUCOSE, CAPILLARY
Glucose-Capillary: 134 mg/dL — ABNORMAL HIGH (ref 70–99)
Glucose-Capillary: 180 mg/dL — ABNORMAL HIGH (ref 70–99)

## 2019-12-01 SURGERY — POSTERIOR LUMBAR FUSION 2 LEVEL
Anesthesia: General

## 2019-12-01 MED ORDER — THROMBIN 20000 UNITS EX SOLR
CUTANEOUS | Status: AC
  Filled 2019-12-01: qty 20000

## 2019-12-01 MED ORDER — OXYCODONE HCL 5 MG PO TABS
10.0000 mg | ORAL_TABLET | ORAL | Status: DC | PRN
  Administered 2019-12-02 – 2019-12-03 (×4): 10 mg via ORAL
  Filled 2019-12-01 (×4): qty 2

## 2019-12-01 MED ORDER — DEXAMETHASONE SODIUM PHOSPHATE 10 MG/ML IJ SOLN
INTRAMUSCULAR | Status: AC
  Filled 2019-12-01: qty 1

## 2019-12-01 MED ORDER — LIDOCAINE 2% (20 MG/ML) 5 ML SYRINGE
INTRAMUSCULAR | Status: AC
  Filled 2019-12-01: qty 5

## 2019-12-01 MED ORDER — POTASSIUM CHLORIDE IN NACL 20-0.9 MEQ/L-% IV SOLN
INTRAVENOUS | Status: DC
  Filled 2019-12-01: qty 1000

## 2019-12-01 MED ORDER — CEFAZOLIN SODIUM-DEXTROSE 2-4 GM/100ML-% IV SOLN
2.0000 g | Freq: Three times a day (TID) | INTRAVENOUS | Status: AC
  Administered 2019-12-01 – 2019-12-02 (×2): 2 g via INTRAVENOUS
  Filled 2019-12-01 (×2): qty 100

## 2019-12-01 MED ORDER — OXYCODONE HCL 5 MG PO TABS: 5.0000 mg | ORAL_TABLET | ORAL | Status: DC | PRN

## 2019-12-01 MED ORDER — ROCURONIUM BROMIDE 10 MG/ML (PF) SYRINGE
PREFILLED_SYRINGE | INTRAVENOUS | Status: AC
  Filled 2019-12-01: qty 10

## 2019-12-01 MED ORDER — PROPOFOL 10 MG/ML IV BOLUS
INTRAVENOUS | Status: AC
  Filled 2019-12-01: qty 40

## 2019-12-01 MED ORDER — LISINOPRIL-HYDROCHLOROTHIAZIDE 20-12.5 MG PO TABS: 1.0000 | ORAL_TABLET | Freq: Every day | ORAL | Status: DC

## 2019-12-01 MED ORDER — ALBUMIN HUMAN 5 % IV SOLN: INTRAVENOUS | Status: DC | PRN

## 2019-12-01 MED ORDER — ZOLPIDEM TARTRATE 5 MG PO TABS: 5.0000 mg | ORAL_TABLET | Freq: Every evening | ORAL | Status: DC | PRN

## 2019-12-01 MED ORDER — 0.9 % SODIUM CHLORIDE (POUR BTL) OPTIME
TOPICAL | Status: DC | PRN
  Administered 2019-12-01 (×2): 1000 mL

## 2019-12-01 MED ORDER — OXYCODONE HCL ER 15 MG PO T12A
15.0000 mg | EXTENDED_RELEASE_TABLET | Freq: Two times a day (BID) | ORAL | Status: DC
  Administered 2019-12-01 – 2019-12-03 (×4): 15 mg via ORAL
  Filled 2019-12-01 (×4): qty 1

## 2019-12-01 MED ORDER — PROPOFOL 10 MG/ML IV BOLUS
INTRAVENOUS | Status: DC | PRN
  Administered 2019-12-01: 180 mg via INTRAVENOUS

## 2019-12-01 MED ORDER — ACETAMINOPHEN 500 MG PO TABS
1000.0000 mg | ORAL_TABLET | Freq: Four times a day (QID) | ORAL | Status: AC
  Administered 2019-12-01 – 2019-12-02 (×3): 1000 mg via ORAL
  Filled 2019-12-01 (×4): qty 2

## 2019-12-01 MED ORDER — THROMBIN 5000 UNITS EX SOLR
CUTANEOUS | Status: AC
  Filled 2019-12-01: qty 5000

## 2019-12-01 MED ORDER — BUPIVACAINE HCL (PF) 0.5 % IJ SOLN
INTRAMUSCULAR | Status: DC | PRN
  Administered 2019-12-01: 30 mL

## 2019-12-01 MED ORDER — SUCCINYLCHOLINE CHLORIDE 200 MG/10ML IV SOSY
PREFILLED_SYRINGE | INTRAVENOUS | Status: DC | PRN
  Administered 2019-12-01: 140 mg via INTRAVENOUS

## 2019-12-01 MED ORDER — MORPHINE SULFATE (PF) 2 MG/ML IV SOLN: 1.0000 mg | INTRAVENOUS | Status: DC | PRN

## 2019-12-01 MED ORDER — ROCURONIUM BROMIDE 10 MG/ML (PF) SYRINGE
PREFILLED_SYRINGE | INTRAVENOUS | Status: DC | PRN
  Administered 2019-12-01: 50 mg via INTRAVENOUS
  Administered 2019-12-01: 30 mg via INTRAVENOUS
  Administered 2019-12-01: 40 mg via INTRAVENOUS
  Administered 2019-12-01: 10 mg via INTRAVENOUS
  Administered 2019-12-01: 50 mg via INTRAVENOUS
  Administered 2019-12-01: 20 mg via INTRAVENOUS

## 2019-12-01 MED ORDER — HYDROMORPHONE HCL 1 MG/ML IJ SOLN
INTRAMUSCULAR | Status: AC
  Administered 2019-12-01: 0.5 mg
  Filled 2019-12-01: qty 1

## 2019-12-01 MED ORDER — SUGAMMADEX SODIUM 200 MG/2ML IV SOLN
INTRAVENOUS | Status: DC | PRN
  Administered 2019-12-01: 200 mg via INTRAVENOUS

## 2019-12-01 MED ORDER — SODIUM CHLORIDE 0.9 % IV SOLN
250.0000 mL | INTRAVENOUS | Status: DC
  Administered 2019-12-01: 250 mL via INTRAVENOUS

## 2019-12-01 MED ORDER — CHLORHEXIDINE GLUCONATE 0.12 % MT SOLN: 15.0000 mL | Freq: Once | OROMUCOSAL | Status: DC

## 2019-12-01 MED ORDER — PHENYLEPHRINE 40 MCG/ML (10ML) SYRINGE FOR IV PUSH (FOR BLOOD PRESSURE SUPPORT)
PREFILLED_SYRINGE | INTRAVENOUS | Status: DC | PRN
  Administered 2019-12-01 (×3): 80 ug via INTRAVENOUS

## 2019-12-01 MED ORDER — PHENYLEPHRINE HCL-NACL 10-0.9 MG/250ML-% IV SOLN
INTRAVENOUS | Status: DC | PRN
  Administered 2019-12-01: 20 ug/min via INTRAVENOUS

## 2019-12-01 MED ORDER — MIDAZOLAM HCL 2 MG/2ML IJ SOLN
INTRAMUSCULAR | Status: AC
  Filled 2019-12-01: qty 2

## 2019-12-01 MED ORDER — GLYCOPYRROLATE PF 0.2 MG/ML IJ SOSY
PREFILLED_SYRINGE | INTRAMUSCULAR | Status: AC
  Filled 2019-12-01: qty 1

## 2019-12-01 MED ORDER — BISACODYL 5 MG PO TBEC: 5.0000 mg | DELAYED_RELEASE_TABLET | Freq: Every day | ORAL | Status: DC | PRN

## 2019-12-01 MED ORDER — ACETAMINOPHEN 325 MG PO TABS: 650.0000 mg | ORAL_TABLET | ORAL | Status: DC | PRN

## 2019-12-01 MED ORDER — ESCITALOPRAM OXALATE 10 MG PO TABS
20.0000 mg | ORAL_TABLET | Freq: Every day | ORAL | Status: DC
  Administered 2019-12-01 – 2019-12-03 (×3): 20 mg via ORAL
  Filled 2019-12-01 (×3): qty 2

## 2019-12-01 MED ORDER — HYDROCHLOROTHIAZIDE 12.5 MG PO CAPS
12.5000 mg | ORAL_CAPSULE | Freq: Every day | ORAL | Status: DC
  Administered 2019-12-01 – 2019-12-03 (×2): 12.5 mg via ORAL
  Filled 2019-12-01 (×3): qty 1

## 2019-12-01 MED ORDER — LIDOCAINE-EPINEPHRINE 0.5 %-1:200000 IJ SOLN
INTRAMUSCULAR | Status: DC | PRN
  Administered 2019-12-01: 10 mL

## 2019-12-01 MED ORDER — CEFAZOLIN SODIUM-DEXTROSE 2-4 GM/100ML-% IV SOLN
2.0000 g | INTRAVENOUS | Status: AC
  Administered 2019-12-01 (×2): 2 g via INTRAVENOUS
  Filled 2019-12-01: qty 100

## 2019-12-01 MED ORDER — LIDOCAINE 2% (20 MG/ML) 5 ML SYRINGE
INTRAMUSCULAR | Status: DC | PRN
  Administered 2019-12-01: 80 mg via INTRAVENOUS

## 2019-12-01 MED ORDER — LISINOPRIL 20 MG PO TABS
20.0000 mg | ORAL_TABLET | Freq: Every day | ORAL | Status: DC
  Administered 2019-12-01 – 2019-12-03 (×2): 20 mg via ORAL
  Filled 2019-12-01 (×3): qty 1

## 2019-12-01 MED ORDER — ONDANSETRON HCL 4 MG/2ML IJ SOLN: 4.0000 mg | Freq: Four times a day (QID) | INTRAMUSCULAR | Status: DC | PRN

## 2019-12-01 MED ORDER — SENNOSIDES-DOCUSATE SODIUM 8.6-50 MG PO TABS: 1.0000 | ORAL_TABLET | Freq: Every evening | ORAL | Status: DC | PRN

## 2019-12-01 MED ORDER — BUPIVACAINE HCL (PF) 0.5 % IJ SOLN
INTRAMUSCULAR | Status: AC
  Filled 2019-12-01: qty 30

## 2019-12-01 MED ORDER — LIDOCAINE-EPINEPHRINE 0.5 %-1:200000 IJ SOLN
INTRAMUSCULAR | Status: AC
  Filled 2019-12-01: qty 1

## 2019-12-01 MED ORDER — DOCUSATE SODIUM 100 MG PO CAPS
100.0000 mg | ORAL_CAPSULE | Freq: Two times a day (BID) | ORAL | Status: DC
  Administered 2019-12-01 – 2019-12-03 (×4): 100 mg via ORAL
  Filled 2019-12-01 (×4): qty 1

## 2019-12-01 MED ORDER — PHENYLEPHRINE HCL (PRESSORS) 10 MG/ML IV SOLN
INTRAVENOUS | Status: AC
  Filled 2019-12-01: qty 1

## 2019-12-01 MED ORDER — SUCCINYLCHOLINE CHLORIDE 200 MG/10ML IV SOSY
PREFILLED_SYRINGE | INTRAVENOUS | Status: AC
  Filled 2019-12-01: qty 10

## 2019-12-01 MED ORDER — ATORVASTATIN CALCIUM 10 MG PO TABS
10.0000 mg | ORAL_TABLET | Freq: Every day | ORAL | Status: DC
  Administered 2019-12-01 – 2019-12-02 (×2): 10 mg via ORAL
  Filled 2019-12-01 (×2): qty 1

## 2019-12-01 MED ORDER — MEPERIDINE HCL 25 MG/ML IJ SOLN: 6.2500 mg | INTRAMUSCULAR | Status: DC | PRN

## 2019-12-01 MED ORDER — OMEGA-3-ACID ETHYL ESTERS 1 G PO CAPS
1.0000 g | ORAL_CAPSULE | Freq: Two times a day (BID) | ORAL | Status: DC
  Administered 2019-12-01 – 2019-12-03 (×4): 1 g via ORAL
  Filled 2019-12-01 (×4): qty 1

## 2019-12-01 MED ORDER — LACTATED RINGERS IV SOLN: INTRAVENOUS | Status: DC

## 2019-12-01 MED ORDER — VITAMIN D 25 MCG (1000 UNIT) PO TABS
1000.0000 [IU] | ORAL_TABLET | Freq: Every day | ORAL | Status: DC
  Administered 2019-12-02 – 2019-12-03 (×2): 1000 [IU] via ORAL
  Filled 2019-12-01 (×2): qty 1

## 2019-12-01 MED ORDER — FENTANYL CITRATE (PF) 250 MCG/5ML IJ SOLN
INTRAMUSCULAR | Status: AC
  Filled 2019-12-01: qty 5

## 2019-12-01 MED ORDER — MENTHOL 3 MG MT LOZG: 1.0000 | LOZENGE | OROMUCOSAL | Status: DC | PRN

## 2019-12-01 MED ORDER — PHENYLEPHRINE 40 MCG/ML (10ML) SYRINGE FOR IV PUSH (FOR BLOOD PRESSURE SUPPORT)
PREFILLED_SYRINGE | INTRAVENOUS | Status: AC
  Filled 2019-12-01: qty 10

## 2019-12-01 MED ORDER — ONDANSETRON HCL 4 MG/2ML IJ SOLN
INTRAMUSCULAR | Status: AC
  Filled 2019-12-01: qty 2

## 2019-12-01 MED ORDER — GLYCOPYRROLATE 0.2 MG/ML IJ SOLN
INTRAMUSCULAR | Status: DC | PRN
  Administered 2019-12-01: .2 mg via INTRAVENOUS

## 2019-12-01 MED ORDER — OMEGA-3 1000 MG PO CAPS: 1.0000 | ORAL_CAPSULE | Freq: Two times a day (BID) | ORAL | Status: DC

## 2019-12-01 MED ORDER — ONDANSETRON HCL 4 MG/2ML IJ SOLN
INTRAMUSCULAR | Status: DC | PRN
  Administered 2019-12-01: 4 mg via INTRAVENOUS

## 2019-12-01 MED ORDER — PHENOL 1.4 % MT LIQD: 1.0000 | OROMUCOSAL | Status: DC | PRN

## 2019-12-01 MED ORDER — ORAL CARE MOUTH RINSE: 15.0000 mL | Freq: Once | OROMUCOSAL | Status: AC

## 2019-12-01 MED ORDER — MIDAZOLAM HCL 2 MG/2ML IJ SOLN
INTRAMUSCULAR | Status: DC | PRN
  Administered 2019-12-01: 2 mg via INTRAVENOUS

## 2019-12-01 MED ORDER — ACETAMINOPHEN 650 MG RE SUPP: 650.0000 mg | RECTAL | Status: DC | PRN

## 2019-12-01 MED ORDER — HYDROMORPHONE HCL 1 MG/ML IJ SOLN: 0.2500 mg | INTRAMUSCULAR | Status: DC | PRN

## 2019-12-01 MED ORDER — MAGNESIUM CITRATE PO SOLN: 1.0000 | Freq: Once | ORAL | Status: DC | PRN

## 2019-12-01 MED ORDER — SODIUM CHLORIDE 0.9 % IV SOLN: INTRAVENOUS | Status: DC | PRN

## 2019-12-01 MED ORDER — DEXAMETHASONE SODIUM PHOSPHATE 10 MG/ML IJ SOLN
INTRAMUSCULAR | Status: DC | PRN
  Administered 2019-12-01: 10 mg via INTRAVENOUS

## 2019-12-01 MED ORDER — FENTANYL CITRATE (PF) 250 MCG/5ML IJ SOLN
INTRAMUSCULAR | Status: DC | PRN
  Administered 2019-12-01: 50 ug via INTRAVENOUS
  Administered 2019-12-01: 150 ug via INTRAVENOUS
  Administered 2019-12-01 (×3): 50 ug via INTRAVENOUS

## 2019-12-01 MED ORDER — METFORMIN HCL 500 MG PO TABS
500.0000 mg | ORAL_TABLET | Freq: Every day | ORAL | Status: DC
  Administered 2019-12-02 – 2019-12-03 (×2): 500 mg via ORAL
  Filled 2019-12-01 (×2): qty 1

## 2019-12-01 MED ORDER — SODIUM CHLORIDE 0.9% FLUSH: 3.0000 mL | INTRAVENOUS | Status: DC | PRN

## 2019-12-01 MED ORDER — ONDANSETRON HCL 4 MG/2ML IJ SOLN: 4.0000 mg | Freq: Once | INTRAMUSCULAR | Status: DC | PRN

## 2019-12-01 MED ORDER — ONDANSETRON HCL 4 MG PO TABS
4.0000 mg | ORAL_TABLET | Freq: Four times a day (QID) | ORAL | Status: DC | PRN
  Administered 2019-12-03: 4 mg via ORAL
  Filled 2019-12-01: qty 1

## 2019-12-01 MED ORDER — CHLORHEXIDINE GLUCONATE CLOTH 2 % EX PADS: 6.0000 | MEDICATED_PAD | Freq: Once | CUTANEOUS | Status: DC

## 2019-12-01 MED ORDER — DIAZEPAM 5 MG PO TABS
5.0000 mg | ORAL_TABLET | Freq: Four times a day (QID) | ORAL | Status: DC | PRN
  Administered 2019-12-02 (×2): 5 mg via ORAL
  Filled 2019-12-01 (×2): qty 1

## 2019-12-01 MED ORDER — ORAL CARE MOUTH RINSE: 15.0000 mL | Freq: Once | OROMUCOSAL | Status: DC

## 2019-12-01 MED ORDER — SODIUM CHLORIDE 0.9% FLUSH
3.0000 mL | Freq: Two times a day (BID) | INTRAVENOUS | Status: DC
  Administered 2019-12-01 – 2019-12-03 (×4): 3 mL via INTRAVENOUS

## 2019-12-01 MED ORDER — CHLORHEXIDINE GLUCONATE 0.12 % MT SOLN
15.0000 mL | Freq: Once | OROMUCOSAL | Status: AC
  Administered 2019-12-01: 15 mL via OROMUCOSAL
  Filled 2019-12-01: qty 15

## 2019-12-01 MED ORDER — THROMBIN 20000 UNITS EX SOLR
CUTANEOUS | Status: DC | PRN
  Administered 2019-12-01: 20 mL via TOPICAL

## 2019-12-01 SURGICAL SUPPLY — 77 items
BAG DECANTER FOR FLEXI CONT (MISCELLANEOUS) ×1 IMPLANT
BASKET BONE COLLECTION (BASKET) ×2 IMPLANT
BENZOIN TINCTURE PRP APPL 2/3 (GAUZE/BANDAGES/DRESSINGS) IMPLANT
BIT DRILL PLIF MAS DISP 5.5MM (DRILL) IMPLANT
BLADE CLIPPER SURG (BLADE) ×2 IMPLANT
BUR MATCHSTICK NEURO 3.0 LAGG (BURR) ×3 IMPLANT
BUR PRECISION FLUTE 5.0 (BURR) ×3 IMPLANT
CAGE POR CASCADIA 8.5X28X12 (Cage) ×4 IMPLANT
CANISTER SUCT 3000ML PPV (MISCELLANEOUS) ×3 IMPLANT
CAP RELINE MOD TULIP RMM (Cap) ×12 IMPLANT
CARTRIDGE OIL MAESTRO DRILL (MISCELLANEOUS) ×1 IMPLANT
CASCADIA AN CONVEX 8.5X28X11 (Cage) ×6 IMPLANT
CLOSURE WOUND 1/2 X4 (GAUZE/BANDAGES/DRESSINGS)
CNTNR URN SCR LID CUP LEK RST (MISCELLANEOUS) ×1 IMPLANT
CONT SPEC 4OZ STRL OR WHT (MISCELLANEOUS) ×2
COVER BACK TABLE 60X90IN (DRAPES) ×2 IMPLANT
COVER WAND RF STERILE (DRAPES) ×3 IMPLANT
DECANTER SPIKE VIAL GLASS SM (MISCELLANEOUS) ×3 IMPLANT
DERMABOND ADVANCED (GAUZE/BANDAGES/DRESSINGS) ×2
DERMABOND ADVANCED .7 DNX12 (GAUZE/BANDAGES/DRESSINGS) ×1 IMPLANT
DIFFUSER DRILL AIR PNEUMATIC (MISCELLANEOUS) ×3 IMPLANT
DRAPE C-ARM 42X72 X-RAY (DRAPES) ×6 IMPLANT
DRAPE C-ARMOR (DRAPES) ×2 IMPLANT
DRAPE LAPAROTOMY 100X72X124 (DRAPES) ×3 IMPLANT
DRAPE SURG 17X23 STRL (DRAPES) ×3 IMPLANT
DRILL PLIF MAS DISP 5.5MM (DRILL) ×3
DRSG OPSITE POSTOP 4X8 (GAUZE/BANDAGES/DRESSINGS) ×2 IMPLANT
DURAPREP 26ML APPLICATOR (WOUND CARE) ×3 IMPLANT
ELECT BLADE 4.0 EZ CLEAN MEGAD (MISCELLANEOUS) ×3
ELECT REM PT RETURN 9FT ADLT (ELECTROSURGICAL) ×3
ELECTRODE BLDE 4.0 EZ CLN MEGD (MISCELLANEOUS) IMPLANT
ELECTRODE REM PT RTRN 9FT ADLT (ELECTROSURGICAL) ×1 IMPLANT
GAUZE 4X4 16PLY RFD (DISPOSABLE) ×2 IMPLANT
GAUZE SPONGE 4X4 12PLY STRL (GAUZE/BANDAGES/DRESSINGS) IMPLANT
GLOVE ECLIPSE 6.5 STRL STRAW (GLOVE) ×6 IMPLANT
GLOVE ECLIPSE 7.5 STRL STRAW (GLOVE) ×10 IMPLANT
GLOVE EXAM NITRILE XL STR (GLOVE) IMPLANT
GLOVE SURG SS PI 7.5 STRL IVOR (GLOVE) ×6 IMPLANT
GLOVE SURG SS PI 8.0 STRL IVOR (GLOVE) ×2 IMPLANT
GOWN STRL REUS W/ TWL LRG LVL3 (GOWN DISPOSABLE) ×2 IMPLANT
GOWN STRL REUS W/ TWL XL LVL3 (GOWN DISPOSABLE) IMPLANT
GOWN STRL REUS W/TWL 2XL LVL3 (GOWN DISPOSABLE) ×4 IMPLANT
GOWN STRL REUS W/TWL LRG LVL3 (GOWN DISPOSABLE) ×2
GOWN STRL REUS W/TWL XL LVL3 (GOWN DISPOSABLE) ×4
GRAFT DURAGEN MATRIX 1WX1L (Tissue) ×2 IMPLANT
INTERBODY CSCD AN CVX8.5X28X11 (Cage) IMPLANT
KIT BASIN OR (CUSTOM PROCEDURE TRAY) ×3 IMPLANT
KIT POSITION SURG JACKSON T1 (MISCELLANEOUS) ×3 IMPLANT
KIT TURNOVER KIT B (KITS) ×3 IMPLANT
MILL MEDIUM DISP (BLADE) ×2 IMPLANT
NDL HYPO 25X1 1.5 SAFETY (NEEDLE) ×1 IMPLANT
NDL SPNL 18GX3.5 QUINCKE PK (NEEDLE) IMPLANT
NEEDLE HYPO 25X1 1.5 SAFETY (NEEDLE) ×3 IMPLANT
NEEDLE SPNL 18GX3.5 QUINCKE PK (NEEDLE) ×3 IMPLANT
NS IRRIG 1000ML POUR BTL (IV SOLUTION) ×5 IMPLANT
OIL CARTRIDGE MAESTRO DRILL (MISCELLANEOUS) ×3
PACK LAMINECTOMY NEURO (CUSTOM PROCEDURE TRAY) ×3 IMPLANT
PAD ARMBOARD 7.5X6 YLW CONV (MISCELLANEOUS) ×3 IMPLANT
PATTIES SURGICAL 1X1 (DISPOSABLE) ×2 IMPLANT
ROD RELINE COCR LORD 5.0X65 (Rod) ×4 IMPLANT
SCREW LOCK RSS 4.5/5.0MM (Screw) ×12 IMPLANT
SCREW SHANK RELINE MOD 5.5X30 (Screw) ×2 IMPLANT
SCREW SHANK RELINE MOD 5.5X35 (Screw) ×2 IMPLANT
SHANK RELINE MOD 5.5X40 (Screw) ×8 IMPLANT
SPONGE LAP 4X18 RFD (DISPOSABLE) IMPLANT
SPONGE SURGIFOAM ABS GEL 100 (HEMOSTASIS) ×3 IMPLANT
STRIP CLOSURE SKIN 1/2X4 (GAUZE/BANDAGES/DRESSINGS) IMPLANT
SUT PROLENE 6 0 BV (SUTURE) ×2 IMPLANT
SUT VIC AB 0 CT1 18XCR BRD8 (SUTURE) ×1 IMPLANT
SUT VIC AB 0 CT1 8-18 (SUTURE) ×4
SUT VIC AB 2-0 CT1 18 (SUTURE) ×3 IMPLANT
SUT VIC AB 3-0 SH 8-18 (SUTURE) ×3 IMPLANT
TOWEL GREEN STERILE (TOWEL DISPOSABLE) ×3 IMPLANT
TOWEL GREEN STERILE FF (TOWEL DISPOSABLE) ×3 IMPLANT
TRAY FOLEY W/BAG SLVR 16FR (SET/KITS/TRAYS/PACK) ×2
TRAY FOLEY W/BAG SLVR 16FR ST (SET/KITS/TRAYS/PACK) IMPLANT
WATER STERILE IRR 1000ML POUR (IV SOLUTION) ×3 IMPLANT

## 2019-12-01 NOTE — Progress Notes (Signed)
Orthopedic Tech Progress Note Patient Details:  Jesse Wyatt 08/11/1960 [id number] Called order into [facility]. Patient ID: [name], male   DOB: 07/28/1960, 59 y.o.   MRN: [id number]   Chip Boer 12/01/2019, 4:21 PM

## 2019-12-01 NOTE — Anesthesia Procedure Notes (Signed)
Procedure Name: Intubation Date/Time: 12/01/2019 8:38 AM Performed by: Verdie Drown, CRNA Pre-anesthesia Checklist: Patient identified, Emergency Drugs available, Suction available and Patient being monitored Patient Re-evaluated:Patient Re-evaluated prior to induction Oxygen Delivery Method: Circle System Utilized Preoxygenation: Pre-oxygenation with 100% oxygen Induction Type: IV induction Laryngoscope Size: Mac, 4 and Glidescope Grade View: Grade I Tube type: Oral Number of attempts: 1 Airway Equipment and Method: Oral airway,  Video-laryngoscopy and Rigid stylet Placement Confirmation: ETT inserted through vocal cords under direct vision,  positive ETCO2 and breath sounds checked- equal and bilateral Secured at: 23 cm Tube secured with: Tape Dental Injury: Teeth and Oropharynx as per pre-operative assessment  Difficulty Due To: Difficulty was anticipated, Difficult Airway- due to large tongue and Difficult Airway- due to reduced neck mobility Future Recommendations: Recommend- induction with short-acting agent, and alternative techniques readily available

## 2019-12-01 NOTE — H&P (Signed)
BP 123/66   Pulse (!) 57   Temp (!) 97.5 F (36.4 C) (Tympanic)   Resp 18   Ht 5\' 10"  (1.778 m)   Wt 117.9 kg   SpO2 98%   BMI 37.31 kg/m  Mr. Milton is a 59 year old gentleman who presents today with a history of back pain greater than 5 years.  He says, however, over the last 6-12 months the pain has only gotten worse.  He wakes up and goes bed with a stiff back.  His thighs and his hips hurt bilaterally.  He takes ibuprofen and gabapentin.  He claims his pain is 4/10.  He had an epidural steroid injection 6-7 years ago and says that it helped temporarily.  He says the pain is equal in both lower extremities.  He is a Geophysicist/field seismologist, right-handed.  He does not use alcohol.  He has no history of substance abuse.  He provided no information about smoking.  He does have a history of hypertension and he was just diagnosed with diabetes.  He has undergone an anterior cervical discectomy and arthrodesis at C6-C7.  He has a CPAP, takes Hydrochlorothiazide, Gabapentin, Atorvastatin, Escitalopram.  Father is 27, is in good health. No information about his mother.  Says he has gained approximately 50 pounds because he is unable to run.  He says he used to run frequently when in the [employer]     REVIEW OF SYSTEMS :  Positive for hypercholesterolemia, leg pain with walking, leg weakness, back pain, leg pain, joint pain, and depression.     EXAM :  He is alert, oriented by 4, answers all questions appropriately.  Memory, language, attention span, and fund of knowledge normal.  Speech is clear, it is also fluent.  Hearing intact to voice.  Uvula elevates midline.  Shoulder shrug is normal.  Tongue protrudes in the midline.  He has 5/5 strength in the upper and lower extremities. All muscle groups were tested.  He has 2+ reflexes, biceps, triceps, brachioradialis, knees, and ankles.  Proprioception is intact in both the upper and lower extremities.  Romberg is negative.  Toe walking and heel walking are done  with ease.  He is able to do a squat also quite easily.     IMAGING :  MRI shows significant stenosis present at L3-4 and L4-5.  He has possibly a slight amount of listhesis present at L4-5.  He has facet arthropathy at both the 3-4 and 4-5 levels, ligamentous hypertrophy all contributing to the stenosis.  The cauda is otherwise normal.  Conus medullaris is normal.  Paraspinous soft tissues are unremarkable.     I discussed both continued epidural injections, doing nothing, possible operation for this.  Mr. Fitzke is of the opinion that he would rather just have it "fixed." As I told him, perfect surgery is not something that can ever be done.  But I do believe operative decompression is the most lasting form of treatment.  I do not believe he needs to be arthrodesed, there would be no placement of screws or rods. He does not need cages.  I think he needs a simple decompression at those 2 levels.  Risks and benefits were explained.  He understand and wishes to proceed.  We will await word from him when it is convenient to undergo the procedure. Mr. Battis returns today with an MRI.  While the stenosis was improved, it is not enough that he has found any significant relief.  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  Given that, I think he needs to undergo a 2-level lumbar arthrodesis at L3-4 and L4-5.  The synovial cyst is not present on the July film and he just needs aggressive bony removal in order to effect a real decompression of the spine at this level.  I do not have to spare the facets.  He is arthropathic at 4-5 and I could just do a full cleanout without having to worry about his stability.       Today, on exam, he has full strength in the lower extremities.  He can toe walk and heel walk.  He has 2+ reflexes knees and ankles.  Normal muscle tone, bulk, and coordination.  Speech is clear.  It is fluent.   Hearing intact to voice.  He is 70 inches, weighs 265 pounds, BMI is 38.02

## 2019-12-01 NOTE — Transfer of Care (Signed)
Immediate Anesthesia Transfer of Care Note  Patient: Jesse Wyatt  Procedure(s) Performed: Lumbar three-four Lumbar four-five Posterior lumbar interbody fusion (N/A )  Patient Location: PACU  Anesthesia Type:General  Level of Consciousness: drowsy and patient cooperative  Airway & Oxygen Therapy: Patient Spontanous Breathing and Patient connected to face mask oxygen  Post-op Assessment: Report given to RN and Post -op Vital signs reviewed and stable  Post vital signs: Reviewed and stable  Last Vitals:  Vitals Value Taken Time  BP 127/64 12/01/19 1542  Temp 37.1 C 12/01/19 1542  Pulse 93 12/01/19 1544  Resp 19 12/01/19 1544  SpO2 97 % 12/01/19 1544  Vitals shown include unvalidated device data.  Last Pain:  Vitals:   12/01/19 0728  TempSrc:   PainSc: 4       Patients Stated Pain Goal: 4 ([id number])  Complications: No apparent anesthesia complications

## 2019-12-02 NOTE — Progress Notes (Signed)
2000: This RN received in report pt. Aaox4, VSS, and honeycomb dressing clean, dry, and intact. Upon initial assessment of patient, bright red blood was noted on honeycomb dressing. Dressing was completely saturated through dressing, gown, and bedsheet. ABD pad was applied over the saturated honeycomb.  2130: Re-assessment of dressing showed saturation through top of ABD pad. MD on-call notified and callback received. No new orders received. Will continue to monitor site.   0300: Blood still draining from incision site and ABD dressing more saturated throughout. Second ABD applied on top of old dressings. Will continue to monitor site.

## 2019-12-02 NOTE — Evaluation (Signed)
Physical Therapy Evaluation Patient Details Name: Jesse Wyatt MRN: [id number] DOB: 02-05-1961 Today's Date: 12/02/2019   History of Present Illness  Pt is 59 yo male with h/o chronic back pain that has worsened, presents for PLIF L3-4 and L4-5. PMH: HTN, DM, OSA, ACDF C6-7, lumbar laminectomy in 11/20.   Clinical Impression  Pt admitted with above diagnosis. Pt with hypotension this morning so close guarding given with all mobility. Reviewed precautions, posture, and activity considerations with pt. Min A needed for bed mobility with keeping precautions. Min A for safety with transfers and gait and chair brought behind. Pt ambulated 125' with RW. 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  Pt currently with functional limitations due to the deficits listed below (see PT Problem List). Pt will benefit from skilled PT to increase their independence and safety with mobility to allow discharge to the venue listed below.       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)    Equipment Recommendations  Rolling walker with 5" wheels    Recommendations for Other Services       Precautions / Restrictions Precautions Precautions: Fall;Back Precaution Booklet Issued: Yes (comment) Precaution Comments: fall precautions due to orthostasis Required Braces or Orthoses: Spinal Brace Spinal Brace: Lumbar corset;Applied in sitting position Restrictions Weight Bearing Restrictions: No      Mobility  Bed Mobility Overal bed mobility: Needs Assistance Bed Mobility: Rolling;Sidelying to Sit Rolling: Supervision Sidelying to sit: Min assist       General bed mobility comments: vc's for sequencing, min HHA for elevation of trunk last  25%  Transfers Overall transfer level: Needs assistance Equipment used: Rolling walker (2 wheeled) Transfers: Sit to/from Stand Sit to Stand: Min assist         General transfer comment: vc's for hand placement but pt unable to rise with both hands on bed, one hand placed on RW and walker stabilized by OT. Did better getting up from chair with arm rests  Ambulation/Gait Ambulation/Gait assistance: Min guard Gait Distance (Feet): 125 Feet Assistive device: Rolling walker (2 wheeled) Gait Pattern/deviations: Step-through pattern;Trunk flexed;Decreased stride length Gait velocity: decreased Gait velocity interpretation: <1.8 ft/sec, indicate of risk for recurrent falls General Gait Details: frequent vc's for erect posture and fwd gaze. Close guarding and chair brought behind for safety as pt has been orthostatic and asymptomatic  Stairs            Wheelchair Mobility    Modified Rankin (Stroke Patients Only)       Balance Overall balance assessment: No apparent balance deficits (not formally assessed)                                           Pertinent Vitals/Pain Pain Assessment: 0-10 Pain Score: 5  Pain Location: low back Pain Descriptors / Indicators: Sore Pain Intervention(s): Limited activity within patient's tolerance;Monitored during session    Home Living Family/patient expects to be discharged to:: Private residence Living Arrangements: Spouse/significant other Available Help at Discharge: Family;Available 24 hours/day Type of Home: House  Home Access: Stairs to enter;Ramped entrance Entrance Stairs-Rails: Right Entrance Stairs-Number of Steps: 3 Home Layout: One level Home Equipment: None Additional Comments: pt's wife is retired. Their son lives with them, he just had B BKA and R below elbow amp.     Prior Function Level of Independence: Independent         Comments: long distance truck driver      Hand Dominance         Extremity/Trunk Assessment   Upper Extremity Assessment Upper Extremity Assessment: Defer to OT evaluation    Lower Extremity Assessment Lower Extremity Assessment: LLE deficits/detail LLE Deficits / Details: strength WFL but noted numbness L hip to knee, anterior thigh LLE Sensation: decreased light touch LLE Coordination: WNL    Cervical / Trunk Assessment Cervical / Trunk Assessment: Normal  Communication   Communication: No difficulties  Cognition Arousal/Alertness: Awake/alert Behavior During Therapy: WFL for tasks assessed/performed Overall Cognitive Status: Within Functional Limits for tasks assessed                                        General Comments General comments (skin integrity, edema, etc.): BP supine before session 97/53, sitting after LE ROM 125/70, sitting after ambulation 92/63.     Exercises     Assessment/Plan    PT Assessment Patient needs continued PT services  PT Problem List Decreased activity tolerance;Decreased mobility;Impaired sensation;Decreased knowledge of use of DME;Decreased knowledge of precautions;Pain       PT Treatment Interventions DME instruction;Gait training;Stair training;Functional mobility training;Therapeutic activities;Therapeutic exercise;Patient/family education    PT Goals (Current goals can be found in the Care Plan section)  Acute Rehab PT Goals Patient Stated Goal: return to home and work PT Goal Formulation: With patient Time For Goal Achievement: 12/09/19 Potential to Achieve Goals: Good    Frequency Min 5X/week   Barriers to discharge        Co-evaluation PT/OT/SLP Co-Evaluation/Treatment: Yes Reason for Co-Treatment: Complexity of the patient's impairments (multi-system involvement);For patient/therapist safety PT goals addressed during session: Mobility/safety with mobility;Balance;Proper use of DME         AM-PAC PT "6 Clicks" Mobility  Outcome Measure Help needed turning from  your back to your side while in a flat bed without using bedrails?: None Help needed moving from lying on your back to sitting on the side of a flat bed without using bedrails?: A Little Help needed moving to and from a bed to a chair (including a wheelchair)?: A Little Help needed standing up from a chair using your arms (e.g., wheelchair or bedside chair)?: A Little Help needed to walk in hospital room?: A Little Help needed climbing 3-5 steps with a railing? : A Little 6 Click Score: 19    End of Session Equipment Utilized During Treatment: Gait belt;Back brace Activity Tolerance: Patient tolerated treatment well Patient left: in chair;with call bell/phone within reach Nurse Communication: Mobility status PT Visit Diagnosis: Pain;Difficulty in walking, not elsewhere classified (R26.2) Pain - Right/Left: Left Pain - part of body: Leg(back)    Time: 0926-1000 PT Time Calculation (min) (ACUTE ONLY): 34 min   Charges:   PT Evaluation $PT Eval Moderate Complexity: [place], PT  Acute Rehab Services  Pager [phone] Office [place] 12/02/2019, 10:43 AM

## 2019-12-02 NOTE — Evaluation (Signed)
Occupational Therapy Evaluation Patient Details Name: Jesse Wyatt MRN: [id number] DOB: [birth year] Today's Date: 12/02/2019    History of Present Illness Pt is 59 yo male with h/o chronic back pain that has worsened, presents for PLIF L3-4 and L4-5. PMH: HTN, DM, OSA, ACDF C6-7, lumbar laminectomy in 11/20.    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 Pt currently requires minA for bed mobility and minA for safe transfers. Pt required minA to don/doff back brace. He required minA for LB dressing. Pt would benefit from continued acute OT services to address independence and safety with dressing, shower transfers, and adherence to back precautions with ADL/IADL and functional mobility completion. Will continue to follow acutely.     Follow Up Recommendations  Follow surgeon's recommendation for DC plan and follow-up therapies    Equipment Recommendations  3 in 1 bedside commode    Recommendations for Other Services       Precautions / Restrictions Precautions Precautions: Fall;Back Precaution Booklet Issued: Yes (comment) Precaution Comments: fall precautions due to orthostasis Required Braces or Orthoses: Spinal Brace Spinal Brace: Lumbar corset;Applied in sitting position Restrictions Weight Bearing Restrictions: No      Mobility Bed Mobility Overal bed mobility: Needs Assistance Bed Mobility: Rolling;Sidelying to Sit Rolling: Supervision Sidelying to sit: Min assist       General bed mobility comments: vc's for sequencing, min HHA for elevation of trunk last 25%  Transfers Overall transfer level: Needs assistance Equipment used: Rolling walker (2 wheeled) Transfers: Sit to/from Stand Sit to Stand: Min assist         General transfer comment: vc's for hand placement but pt unable to rise with both hands on bed, one hand placed on RW and  walker stabilized by OT. Did better getting up from chair with arm rests    Balance Overall balance assessment: No apparent balance deficits (not formally assessed)                                         ADL either performed or assessed with clinical judgement   ADL Overall ADL's : Needs assistance/impaired Eating/Feeding: Set up;Sitting   Grooming: Min guard;Standing Grooming Details (indicate cue type and reason): educated pt on compensatory strategies for ADL completion Upper Body Bathing: Set up;Sitting   Lower Body Bathing: Minimal assistance;Sit to/from stand   Upper Body Dressing : Minimal assistance;Sitting Upper Body Dressing Details (indicate cue type and reason): minA to don/doff back brace Lower Body Dressing: Minimal assistance;Sit to/from stand Lower Body Dressing Details (indicate cue type and reason): pt able to figure-4 BLE Toilet Transfer: Min guard;Ambulation;RW   Toileting- Clothing Manipulation and Hygiene: Minimal assistance;Sit to/from stand Toileting - Clothing Manipulation Details (indicate cue type and reason): minA to powerup into standing     Functional mobility during ADLs: Minimal assistance;Rolling walker General ADL Comments: educated pt on completion of ADL while adhering to precautions;     Vision         Perception     Praxis      Pertinent Vitals/Pain Pain Assessment: 0-10 Pain Score: 5  Pain Location: low back Pain Descriptors / Indicators: Sore Pain Intervention(s): Limited activity within patient's tolerance;Monitored during session     Hand Dominance Right   Extremity/Trunk Assessment Upper  Extremity Assessment Upper Extremity Assessment: Overall WFL for tasks assessed   Lower Extremity Assessment Lower Extremity Assessment: Defer to PT evaluation;LLE deficits/detail LLE Deficits / Details: strength WFL but noted numbness L hip to knee, anterior thigh LLE Sensation: decreased light touch LLE  Coordination: WNL   Cervical / Trunk Assessment Cervical / Trunk Assessment: Normal   Communication Communication Communication: No difficulties   Cognition Arousal/Alertness: Awake/alert Behavior During Therapy: WFL for tasks assessed/performed Overall Cognitive Status: Within Functional Limits for tasks assessed                                     General Comments  BP supine before session 97/53, sitting after LE ROM 125/70, sitting after ambulation 92/63. RN aware    Exercises     Shoulder Instructions      Home Living Family/patient expects to be discharged to:: Private residence Living Arrangements: Spouse/significant other Available Help at Discharge: Family;Available 24 hours/day Type of Home: House Home Access: Stairs to enter;Ramped entrance Entrance Stairs-Number of Steps: 3 Entrance Stairs-Rails: Right Home Layout: One level     Bathroom Shower/Tub: Occupational psychologist: Standard     Home Equipment: None   Additional Comments: pt's wife is retired. Their son lives with them, he just had B BKA and R below elbow amp.       Prior Functioning/Environment Level of Independence: Independent        Comments: long distance truck driver         OT Problem List: Decreased range of motion;Decreased knowledge of precautions;Obesity;Pain      OT Treatment/Interventions: Self-care/ADL training;Therapeutic exercise;DME and/or AE instruction;Therapeutic activities;Patient/family education    OT Goals(Current goals can be found in the care plan section) Acute Rehab OT Goals Patient Stated Goal: return to home and work OT Goal Formulation: With patient Time For Goal Achievement: 12/16/19 Potential to Achieve Goals: Good ADL Goals Pt Will Perform Grooming: with modified independence;standing Pt Will Perform Lower Body Dressing: with modified independence;sit to/from stand Pt Will Transfer to Toilet: with modified  independence;ambulating Additional ADL Goal #1: Pt will demonstrate independence with adherence to 3/3 precautions during ADL/IADL and functional mobility.  OT Frequency: Min 2X/week   Barriers to D/C:            Co-evaluation PT/OT/SLP Co-Evaluation/Treatment: Yes Reason for Co-Treatment: Complexity of the patient's impairments (multi-system involvement);For patient/therapist safety;To address functional/ADL transfers PT goals addressed during session: Mobility/safety with mobility;Balance;Proper use of DME OT goals addressed during session: ADL's and self-care      AM-PAC OT "6 Clicks" Daily Activity     Outcome Measure Help from another person eating meals?: A Little Help from another person taking care of personal grooming?: A Little Help from another person toileting, which includes using toliet, bedpan, or urinal?: A Little Help from another person bathing (including washing, rinsing, drying)?: A Little Help from another person to put on and taking off regular upper body clothing?: A Little Help from another person to put on and taking off regular lower body clothing?: A Little 6 Click Score: 18   End of Session Equipment Utilized During Treatment: Gait belt;Rolling walker;Back brace Nurse Communication: Mobility status  Activity Tolerance: Patient tolerated treatment well Patient left: in chair;with call bell/phone within reach  OT Visit Diagnosis: Other abnormalities of gait and mobility (R26.89);Pain Pain - Right/Left: (back)  Time: 0920-1001 OT Time Calculation (min): 41 min Charges:  OT General Charges $OT Visit: 1 Visit OT Evaluation $OT Eval Moderate Complexity: 1 Mod OT Treatments $Self Care/Home Management : 8-22 mins  Helene Kelp OTR/L Acute Rehabilitation Services Office: [phone]   Wyn Forster 12/02/2019, 12:03 PM

## 2019-12-02 NOTE — Progress Notes (Signed)
Patient ID: Jesse Wyatt, male   DOB: 02-04-1961, 59 y.o.   MRN: [id number] BP (!) 94/57 (BP Location: Right Arm)   Pulse 69   Temp 98 F (36.7 C) (Oral)   Resp 17   Ht 5\' 10"  (1.778 m)   Wt 117.9 kg   SpO2 95%   BMI 37.31 kg/m  Alert and oriented x 4, speech is clear and fluent Moving lower extremities well Wound is clean, dry Continue PT

## 2019-12-03 MED ORDER — DIAZEPAM 5 MG PO TABS: 5.0000 mg | ORAL_TABLET | Freq: Four times a day (QID) | ORAL | 0 refills | Status: DC | PRN

## 2019-12-03 MED ORDER — OXYCODONE HCL 5 MG PO TABS: 5.0000 mg | ORAL_TABLET | ORAL | 0 refills | Status: AC | PRN

## 2019-12-03 NOTE — Discharge Summary (Signed)
Physician Discharge Summary  Patient ID: Jesse Wyatt MRN: [id number] DOB/AGE: July 01, 1961 59 y.o.  Admit date: 12/01/2019 Discharge date: 12/03/2019  Admission Diagnoses:spondylolisthesis L3/4,4/5  Discharge Diagnoses: same Active Problems:   Spondylolisthesis of lumbar region   Discharged Condition: good  Hospital Course: [name] underwent a two level decompression and PLIF at L3/4,4/5. Post op he is alert, oriented, and speaking fluently. He is voiding, ambulating, and tolerating a regular diet. His wound is clean, small amount of drainage from the superior portion of the wound. I changed the dressing prior to discharge.   Treatments: surgery: PLIF L3/4,4/5 cascade(stryker cages), 56mm x2 L3/4, 17mm x2 L4/5 Posterolateral arthrodesis L3-5,  Autograft morsels Segmental pedicle screw fixation L3-L5 nuvasive relign Discharge Exam: Blood pressure 119/62, pulse 72, temperature 98.2 F (36.8 C), temperature source Oral, resp. rate 18, height 5\' 10"  (1.778 m), weight 117.9 kg, SpO2 95 %. General appearance: alert, cooperative, appears stated age and mild distress Strength is normal, muscle tone and bulk normal. Disposition: Discharge disposition: 01-Home or Self Care      Spinal stenosis, lumbar region  Allergies as of 12/03/2019      Reactions   Poison Oak Extract       Medication List    TAKE these medications   atorvastatin 20 MG tablet Commonly known as: LIPITOR Take 10 mg by mouth at bedtime.   cholecalciferol 25 MCG (1000 UNIT) tablet Commonly known as: VITAMIN D3 Take 1,000 Units by mouth daily.   diazepam 5 MG tablet Commonly known as: VALIUM Take 1 tablet (5 mg total) by mouth every 6 (six) hours as needed for muscle spasms.   escitalopram 20 MG tablet Commonly known as: LEXAPRO Take 20 mg by mouth daily.   ibuprofen 400 MG tablet Commonly known as: ADVIL Take 400 mg by mouth 3 (three) times daily as needed for headache or moderate pain.    lisinopril-hydrochlorothiazide 20-12.5 MG tablet Commonly known as: ZESTORETIC Take 1 tablet by mouth daily.   metFORMIN 500 MG tablet Commonly known as: GLUCOPHAGE Take 500 mg by mouth daily with breakfast.   Omega-3 1000 MG Caps Take 1 capsule by mouth 2 (two) times daily.   oxyCODONE 5 MG immediate release tablet Commonly known as: Oxy IR/ROXICODONE Take 1 tablet (5 mg total) by mouth every 4 (four) hours as needed for up to 7 days for moderate pain ((score 4 to 6)).   sildenafil 100 MG tablet Commonly known as: VIAGRA Take 100 mg by mouth daily as needed for erectile dysfunction.      Follow-up Information    Ashok Pall, MD Follow up in 3 week(s).   Specialty: Neurosurgery Why: please call to make an appointment Contact information: [address] [place] [address] [phone]           Signed: Ashok Pall 12/03/2019, 3:48 PM

## 2019-12-03 NOTE — Progress Notes (Signed)
Patient discharge with wife present. AVS reviewed and hard copy prescription given with AVS. Pt. Did not want to wait on DME as he states he has that stuff at home bc his son is amputee. Wife was in a bit of a rush to get him home. CPAP with wife, glasses on patient. Provider changed dressing. Simmie Davies RN

## 2019-12-03 NOTE — Discharge Instructions (Signed)

## 2019-12-03 NOTE — Op Note (Signed)
12/01/2019  3:57 PM  PATIENT:  Jesse Wyatt  59 y.o. male with listhesis at L3/4,4/5. He is also quite stenotic at L3/4,4/54  PRE-OPERATIVE DIAGNOSIS:  Spinal stenosis, lumbar region L3/4,4/5 Spondylolisthesis L3/4,4/5  POST-OPERATIVE DIAGNOSIS:  Spinal stenosis, lumbar region L3/4,4/5 Spondylolisthesis L3/4 4/5 PROCEDURE:  Procedure(s): redo laminectomy L3,4,and 5  Lumbar three-four Lumbar four-five Posterior lumbar interbody fusion Stryker cages L3./4 48mm x78mm x2, L4/5 80mm x53mm x2  SURGEON:  Surgeon(s): Ashok Pall, MD  ASSISTANTS:none  ANESTHESIA:   general  EBL:  No intake/output data recorded.  BLOOD ADMINISTERED:250 CC CELLSAVER  CELL SAVER GIVEN:yes  COUNT:per nursing  DRAINS: none   SPECIMEN:  No Specimen  DICTATION: Jesse Wyatt is a 59 y.o. male whom was taken to the operating room intubated, and placed under a general anesthetic without difficulty. A foley catheter was placed under sterile conditions. He was positioned prone on a Jackson stable with all pressure points properly padded.  His lumbar region was prepped and draped in a sterile manner. I infiltrated 20cc's 1/2%lidocaine/1:2000,000 strength epinephrine into the planned incision. I opened the skin with a 10 blade and took the incision down to the thoracolumbar fascia. I exposed the lamina of L2,3,4,and 5 in a subperiosteal fashion bilaterally. I confirmed my location with an intraoperative xray.  I placed self retaining retractors and started the decompression.  I decompressed the spinal canal via redo laminectomies of L2,3,4, and 5. I removed a great deal of scar tissue and lamina remnants. The ligamentum was thick and adherent to the dura. I was able to free the L3, L4, and L5 roots using the drill and Kerrison punches.There was a dural rent at the L4 root. I was able to close it primarily. I exposed the disc spaces bilaterally. PLIF's were performed at L3/4,4/5 in the same fashion. I opened the  disc space with a 15 blade then used a variety of instruments to remove the disc and prepare the space for the arthrodesis. I used curettes, rongeurs, punches, shavers for the disc space, and rasps in the discetomy. I measured the disc space and placed Stryker titaniuma  cages(cascade) into the disc space(s).  I decorticated the lateral bone at L3,4,and 5. I then placed autograft morsels on the decorticated surfaces to complete the posterolateral arthrodesis.  I placed pedicle screws at L3,4,and 5, using fluoroscopic guidance. I drilled a pilot hole, then cannulated the pedicle with a drill/or tap at each site. I then tapped each pedicle, assessing each site for pedicle violations. No cutouts were appreciated. Screws Harlin Heys) were then placed at each site without difficulty. I attached rods and locking caps with the appropriate tools. The locking caps were secured with torque limited screwdrivers. Final films were performed and the final construct appeared to be in good position.  I closed the wound in a layered fashion. I approximated the thoracolumbar fascia, subcutaneous, and subcuticular planes with vicryl sutures. I used dermabond and an occlusive bandage for a sterile dressing.     PLAN OF CARE: Admit to inpatient   PATIENT DISPOSITION:  PACU - hemodynamically stable.   Delay start of Pharmacological VTE agent (>24hrs) due to surgical blood loss or risk of bleeding:  yes

## 2019-12-03 NOTE — Anesthesia Postprocedure Evaluation (Signed)
Anesthesia Post Note  Patient: Jesse Wyatt  Procedure(s) Performed: Lumbar three-four Lumbar four-five Posterior lumbar interbody fusion (N/A )     Patient location during evaluation: PACU Anesthesia Type: General Level of consciousness: awake and alert Pain management: pain level controlled Vital Signs Assessment: post-procedure vital signs reviewed and stable Respiratory status: spontaneous breathing, nonlabored ventilation, respiratory function stable and patient connected to nasal cannula oxygen Cardiovascular status: blood pressure returned to baseline and stable Postop Assessment: no apparent nausea or vomiting Anesthetic complications: no    Last Vitals:  Vitals:   12/03/19 0000 12/03/19 0313  BP: (!) 109/53 119/71  Pulse: 86 74  Resp: 16 18  Temp: 37.1 C 36.8 C  SpO2: 93% 94%    Last Pain:  Vitals:   12/03/19 0720  TempSrc:   PainSc: 8                  Quindarius Cabello S

## 2019-12-03 NOTE — Progress Notes (Signed)
Occupational Therapy Treatment Patient Details Name: Jesse Wyatt MRN: [id number] DOB: [birth year] Today's Date: 12/03/2019    History of present illness Pt is 59 yo male with h/o chronic back pain that has worsened, presents for PLIF L3-4 and L4-5. PMH: HTN, DM, OSA, ACDF C6-7, lumbar laminectomy in 11/20.    OT comments  Pt making steady progress towards OT goals this session. Session focus on functional mobility with RW, simulated shower transfer and education on maintaining back precautions into ADL routine. Overall, pt requires min A for functional mobility with RW and MIN a for simulated walkin shower transfer. Initiated education on LB AE for bathing and dressing, however pt would benefit from full demo of equipment. Pt required supervision for bed mobility via log roll technique and MIN A to don brace EOB however pt able to direct caregiver of how to don/doff. DC plan remains appropriate, will follow acutely per POC.    Follow Up Recommendations  Follow surgeon's recommendation for DC plan and follow-up therapies    Equipment Recommendations  3 in 1 bedside commode    Recommendations for Other Services      Precautions / Restrictions Precautions Precautions: Fall;Back Precaution Booklet Issued: Yes (comment) Precaution Comments: able to recall 1/3 precautions; no reports of dizziness or feeling lightheaded Required Braces or Orthoses: Spinal Brace Spinal Brace: Lumbar corset;Applied in sitting position Restrictions Weight Bearing Restrictions: No       Mobility Bed Mobility Overal bed mobility: Needs Assistance Bed Mobility: Rolling;Sidelying to Sit Rolling: Supervision Sidelying to sit: Supervision       General bed mobility comments: pt impulsively began bed mobility; supervision to roll to R side with use of rails and supervision and increased time to elevate trunk into sitting from sidelying  Transfers Overall transfer level: Needs assistance Equipment used:  Rolling walker (2 wheeled) Transfers: Sit to/from Stand Sit to Stand: Min assist         General transfer comment: light MIN A to power into standing with cues for hand placement    Balance Overall balance assessment: Needs assistance Sitting-balance support: Feet supported;No upper extremity supported Sitting balance-Leahy Scale: Fair Sitting balance - Comments: able to static sit EOB with no LOB   Standing balance support: Single extremity supported Standing balance-Leahy Scale: Fair Standing balance comment: able to reach with one UE supported with no apparent LOB                           ADL either performed or assessed with clinical judgement   ADL Overall ADL's : Needs assistance/impaired                 Upper Body Dressing : Minimal assistance;Sitting Upper Body Dressing Details (indicate cue type and reason): minA to don/doff back brace; able to direct caregive of how to don Lower Body Dressing: Total assistance;Sitting/lateral leans Lower Body Dressing Details (indicate cue type and reason): total A to don shoes EOB; unable to figure four this session; brief education on LB AE for dressing; pt reports wife will likely help but pt would benefit from demo of all AE; issued pt handout Toilet Transfer: Min guard;Ambulation;RW Toilet Transfer Details (indicate cue type and reason): simulated via functional mobility with RW   Toileting - Clothing Manipulation Details (indicate cue type and reason): education on no twisting during posterior pericare Tub/ Shower Transfer: Walk-in shower;Minimal assistance;Shower seat;Rolling walker;Ambulation;Cueing for safety Tub/Shower Transfer Details (indicate cue type and reason): MIN A  for simulated shower transfer over threshold with RW; demo of going in backward with RW or sidestep with grab bar Functional mobility during ADLs: Minimal assistance;Rolling walker General ADL Comments: pt continues to present with pain,  back precautions and decreased activity tolerance impacting pts ability to engage in ADLs. Pt reports DME needs are met. Education initiated on LB AE but would benefit from further demo     Vision       Perception     Praxis      Cognition Arousal/Alertness: Awake/alert Behavior During Therapy: WFL for tasks assessed/performed Overall Cognitive Status: Within Functional Limits for tasks assessed                                          Exercises     Shoulder Instructions       General Comments wife present during session; issued pt LB AE handout    Pertinent Vitals/ Pain       Pain Assessment: Faces Pain Location: low back Pain Descriptors / Indicators: Sore;Other (Comment)("stiff") Pain Intervention(s): Monitored during session;Repositioned  Home Living                                          Prior Functioning/Environment              Frequency  Min 2X/week        Progress Toward Goals  OT Goals(current goals can now be found in the care plan section)  Progress towards OT goals: Progressing toward goals  Acute Rehab OT Goals Patient Stated Goal: return to home and work OT Goal Formulation: With patient Time For Goal Achievement: 12/16/19 Potential to Achieve Goals: Good  Plan Discharge plan remains appropriate    Co-evaluation                 AM-PAC OT "6 Clicks" Daily Activity     Outcome Measure   Help from another person eating meals?: None Help from another person taking care of personal grooming?: A Little Help from another person toileting, which includes using toliet, bedpan, or urinal?: A Little Help from another person bathing (including washing, rinsing, drying)?: A Little Help from another person to put on and taking off regular upper body clothing?: A Little Help from another person to put on and taking off regular lower body clothing?: A Little 6 Click Score: 19    End of Session  Equipment Utilized During Treatment: Rolling walker  OT Visit Diagnosis: Other abnormalities of gait and mobility (R26.89);Pain   Activity Tolerance Patient tolerated treatment well   Patient Left in chair;with family/visitor present   Nurse Communication Mobility status;Other (comment)(sitting near window with wife present assisting with managing call bell)        Time: 3664-4034 OT Time Calculation (min): 17 min  Charges: OT General Charges $OT Visit: 1 Visit OT Treatments $Self Care/Home Management : 8-22 mins  Lanier Clam., COTA/L Acute Rehabilitation Services [phone]   Ihor Gully 12/03/2019, 3:11 PM

## 2019-12-03 NOTE — Progress Notes (Signed)
Physical Therapy Treatment Patient Details Name: Traveon Eady MRN: [id number] DOB: 12-27-1960 Today's Date: 12/03/2019    History of Present Illness Pt is 59 yo male with h/o chronic back pain that has worsened, presents for PLIF L3-4 and L4-5. PMH: HTN, DM, OSA, ACDF C6-7, lumbar laminectomy in 11/20.     PT Comments    Pt tolerated treatment well, ambulating for increased distances this session and with improved recall of back precautions. Pt is able to ambulate for household distances with use of RW at this time without physical assistance. Pt does require minA to correct LOB when completing final stair of stair negotiation with posterior LOB, otherwise no significant balance deviations this session. Pt will continue to benefit from aggressive PT POC to improve gait quality and activity tolerance. PT continues to recommend outpatient PT services upon discharge as well as a RW.   Follow Up Recommendations  Outpatient PT     Equipment Recommendations  Rolling walker with 5" wheels    Recommendations for Other Services       Precautions / Restrictions Precautions Precautions: Fall;Back Precaution Booklet Issued: No Precaution Comments: verbally reviewed back precautions and brace use Required Braces or Orthoses: Spinal Brace Spinal Brace: Lumbar corset(on upon arrival, left on at end of session) Restrictions Weight Bearing Restrictions: No    Mobility  Bed Mobility Overal bed mobility: Needs Assistance Bed Mobility: Rolling;Sidelying to Sit Rolling: Supervision Sidelying to sit: Supervision       General bed mobility comments: pt received and left sitting in recliner  Transfers Overall transfer level: Needs assistance Equipment used: Rolling walker (2 wheeled) Transfers: Sit to/from Stand Sit to Stand: Supervision         General transfer comment: light MIN A to power into standing with cues for hand placement  Ambulation/Gait Ambulation/Gait assistance:  Supervision Gait Distance (Feet): 250 Feet Assistive device: Rolling walker (2 wheeled) Gait Pattern/deviations: Step-through pattern Gait velocity: reduced Gait velocity interpretation: 1.31 - 2.62 ft/sec, indicative of limited community ambulator General Gait Details: pt with shortened step through gait, reduced gait speed, slight increase in trunk flexion   Stairs Stairs: Yes Stairs assistance: Min assist Stair Management: One rail Right(one rail right ascending, one rail left descending) Number of Stairs: 5 General stair comments: pt with one LOB when descending final step requiring minA to correct, otherwise close supervision for stair negotiation. PT suggests negotiating steps sideways with BUE on railing for safety initially at home   Wheelchair Mobility    Modified Rankin (Stroke Patients Only)       Balance Overall balance assessment: Needs assistance Sitting-balance support: No upper extremity supported;Feet supported Sitting balance-Leahy Scale: Good Sitting balance - Comments: supervision sitting in recliner statically   Standing balance support: Bilateral upper extremity supported Standing balance-Leahy Scale: Fair Standing balance comment: close supervision with BUE support of RW                            Cognition Arousal/Alertness: Awake/alert Behavior During Therapy: WFL for tasks assessed/performed Overall Cognitive Status: Within Functional Limits for tasks assessed                                        Exercises      General Comments General comments (skin integrity, edema, etc.): wife present, VSS      Pertinent Vitals/Pain Pain Assessment: 0-10  Pain Score: 5  Pain Location: low back Pain Descriptors / Indicators: Sore Pain Intervention(s): Monitored during session    Home Living                      Prior Function            PT Goals (current goals can now be found in the care plan section)  Acute Rehab PT Goals Patient Stated Goal: return to home and work Progress towards PT goals: Progressing toward goals    Frequency    Min 5X/week      PT Plan Current plan remains appropriate    Co-evaluation              AM-PAC PT "6 Clicks" Mobility   Outcome Measure  Help needed turning from your back to your side while in a flat bed without using bedrails?: None Help needed moving from lying on your back to sitting on the side of a flat bed without using bedrails?: A Little Help needed moving to and from a bed to a chair (including a wheelchair)?: None Help needed standing up from a chair using your arms (e.g., wheelchair or bedside chair)?: None Help needed to walk in hospital room?: None Help needed climbing 3-5 steps with a railing? : A Little 6 Click Score: 22    End of Session Equipment Utilized During Treatment: Back brace Activity Tolerance: Patient tolerated treatment well Patient left: in chair;with family/visitor present(pt requesting to sit near window, call bell out of reach) Nurse Communication: Mobility status PT Visit Diagnosis: Pain;Difficulty in walking, not elsewhere classified (R26.2) Pain - part of body: (back)     Time: [id number] PT Time Calculation (min) (ACUTE ONLY): 11 min  Charges:  $Gait Training: 8-22 mins                     Zenaida Niece, PT, DPT Acute Rehabilitation Pager: [place] 12/03/2019, 3:16 PM

## 2019-12-04 MED FILL — Sodium Chloride IV Soln 0.9%: INTRAVENOUS | Qty: 1000 | Status: AC

## 2019-12-04 MED FILL — Heparin Sodium (Porcine) Inj 1000 Unit/ML: INTRAMUSCULAR | Qty: 30 | Status: AC

## 2019-12-11 ENCOUNTER — Telehealth: Payer: Self-pay

## 2019-12-11 NOTE — Telephone Encounter (Signed)
Spoke to pt after recent back procedure. He said he was doing well. He did get Dr Alla German message while in the hospital.

## 2019-12-23 ENCOUNTER — Other Ambulatory Visit: Payer: Self-pay | Admitting: Neurosurgery

## 2019-12-23 ENCOUNTER — Ambulatory Visit (INDEPENDENT_AMBULATORY_CARE_PROVIDER_SITE_OTHER): Payer: Managed Care, Other (non HMO)

## 2019-12-23 ENCOUNTER — Other Ambulatory Visit: Payer: Self-pay

## 2019-12-23 DIAGNOSIS — M48061 Spinal stenosis, lumbar region without neurogenic claudication: Secondary | ICD-10-CM

## 2020-03-04 ENCOUNTER — Encounter: Payer: Self-pay | Admitting: Internal Medicine

## 2020-05-10 ENCOUNTER — Other Ambulatory Visit: Payer: Self-pay | Admitting: Neurosurgery

## 2020-05-11 ENCOUNTER — Emergency Department (HOSPITAL_COMMUNITY)
Admission: EM | Admit: 2020-05-11 | Discharge: 2020-05-11 | Disposition: A | Discharge status: Home / Self Care | Payer: No Typology Code available for payment source | Source: Home / Self Care | Attending: Emergency Medicine | Admitting: Emergency Medicine

## 2020-05-11 DIAGNOSIS — Z79899 Other long term (current) drug therapy: Secondary | ICD-10-CM | POA: Insufficient documentation

## 2020-05-11 DIAGNOSIS — I10 Essential (primary) hypertension: Secondary | ICD-10-CM | POA: Insufficient documentation

## 2020-05-11 DIAGNOSIS — M62831 Muscle spasm of calf: Secondary | ICD-10-CM | POA: Insufficient documentation

## 2020-05-11 DIAGNOSIS — Z7984 Long term (current) use of oral hypoglycemic drugs: Secondary | ICD-10-CM | POA: Insufficient documentation

## 2020-05-11 DIAGNOSIS — M62830 Muscle spasm of back: Secondary | ICD-10-CM | POA: Insufficient documentation

## 2020-05-11 DIAGNOSIS — M62838 Other muscle spasm: Secondary | ICD-10-CM

## 2020-05-11 DIAGNOSIS — E119 Type 2 diabetes mellitus without complications: Secondary | ICD-10-CM | POA: Insufficient documentation

## 2020-05-11 DIAGNOSIS — M48062 Spinal stenosis, lumbar region with neurogenic claudication: Secondary | ICD-10-CM | POA: Diagnosis not present

## 2020-05-11 MED ORDER — DIAZEPAM 5 MG PO TABS: 5.0000 mg | ORAL_TABLET | Freq: Four times a day (QID) | ORAL | 0 refills | Status: AC | PRN

## 2020-05-11 MED ORDER — HYDROMORPHONE HCL 1 MG/ML IJ SOLN
1.0000 mg | Freq: Once | INTRAMUSCULAR | Status: AC
  Administered 2020-05-11: 1 mg via INTRAVENOUS
  Filled 2020-05-11: qty 1

## 2020-05-11 MED ORDER — DIAZEPAM 5 MG PO TABS
5.0000 mg | ORAL_TABLET | Freq: Once | ORAL | Status: AC
  Administered 2020-05-11: 5 mg via ORAL
  Filled 2020-05-11: qty 1

## 2020-05-11 MED ORDER — METHOCARBAMOL 500 MG PO TABS
750.0000 mg | ORAL_TABLET | Freq: Once | ORAL | Status: AC
  Administered 2020-05-11: 750 mg via ORAL
  Filled 2020-05-11: qty 2

## 2020-05-11 MED ORDER — KETOROLAC TROMETHAMINE 15 MG/ML IJ SOLN
15.0000 mg | Freq: Once | INTRAMUSCULAR | Status: AC
  Administered 2020-05-11: 15 mg via INTRAVENOUS
  Filled 2020-05-11: qty 1

## 2020-05-11 NOTE — ED Provider Notes (Signed)
[facility] EMERGENCY DEPARTMENT Provider Note   CSN: [id number] Arrival date & time: 05/11/20  1610     History Chief Complaint  Patient presents with  . Back Pain    Jesse Wyatt is a 59 y.o. male.  HPI   Jesse Wyatt is a 59 y.o. gentleman who has had lumbar surgery x 2, most recently with revision of laminectomy and PLIF's of L3/4, L4/5, and L5 by Dr. Christella Noa 12/03/19, presenting with severe lower back pain. He states that he initially tolerated the procedure well, but starting two weeks ago began having intermittent muscle spasms lasting a minute or two at a time. He is presenting to the ED today because he started experiencing severe back pain associated with diffuse muscle spasms in his back and legs, now lasting 30 minutes at a time. He states his back pain is centrally located in his lumbar spine and radiates down his bilateral buttocks, into his right hip, groin, and bilateral thighs. He endorses associated numbness and tingling in his bilateral anterior thighs and numbness in his first and second toes on the left. He also endorses weakness in his legs, stating he can only stand for 1-2 minutes at a time and cannot stand upright. Movement worsens his pain, and laying on his left side improves his pain. He has been prescribed oxycodone 5mg  PRN at home, but notes he has been taking 10mg  only at night for sleep, and took 10mg  this afternoon prior to coming to the ED. He denies taking any other medications for pain or spasms. He says he had an MRI a couple of weeks ago that showed disc compression, but no MRI is in the system. He did have a second lumbar XR 12/23/19 that showed T12-L1 and L1-L2 disc space narrowing but no other concerning findings. He states he is scheduled to have another surgery with Dr. Christella Noa this coming Friday afternoon. He endorses nausea earlier today secondary to pain but denies vomiting, fevers, chills, recent weight changes, night sweats, or  any other symptoms.   Past Medical History:  Diagnosis Date  . Chronic back pain   . Chronic headaches    does have photosensitivity. Ibuprofen helps  . Colon polyps    colonoscopy every 5 years  . Depression   . Diabetes mellitus without complication ([place])   . High cholesterol   . Hypertension   . Prediabetes   . PTSD (post-traumatic stress disorder)   . Sleep apnea    uses a cpap    Patient Active Problem List   Diagnosis Date Noted  . Lumbar stenosis with neurogenic claudication 05/14/2020  . Spondylolisthesis of lumbar region 12/01/2019  . Preventative health care 12/20/2018  . Sleep apnea   . High cholesterol   . Hypertension   . PTSD (post-traumatic stress disorder)   . Colon polyps   . Chronic back pain   . Prediabetes     Past Surgical History:  Procedure Laterality Date  . BACK SURGERY     x 2  . CERVICAL FUSION  ~2000  . LAPAROSCOPIC GASTRIC BANDING  ~2010   didn't help  . SHOULDER SURGERY         Family History  Problem Relation Age of Onset  . Stroke Mother   . Hypertension Mother   . Diabetes Mother   . COPD Father   . Atrial fibrillation Father   . Prostate cancer Brother     Social History   Tobacco Use  . Smoking status:  Never Smoker  . Smokeless tobacco: Never Used  Vaping Use  . Vaping Use: Never used  Substance Use Topics  . Alcohol use: Yes    Comment: occ  . Drug use: Never    Home Medications Prior to Admission medications   Medication Sig Start Date End Date Taking? Authorizing Provider  atorvastatin (LIPITOR) 20 MG tablet Take 10 mg by mouth at bedtime.     [provider]  buPROPion (WELLBUTRIN) 75 MG tablet Take 75 mg by mouth every morning. 02/24/20   [provider]  cholecalciferol (VITAMIN D3) 25 MCG (1000 UNIT) tablet Take 1,000 Units by mouth daily.    [provider]  diazepam (VALIUM) 5 MG tablet Take 1 tablet (5 mg total) by mouth every 6 (six) hours as needed for muscle spasms.  05/11/20   Jeralyn Bennett, MD  escitalopram (LEXAPRO) 20 MG tablet Take 20 mg by mouth every evening.     [provider]  lidocaine (LIDODERM) 5 % Place 1 patch onto the skin daily as needed (pain.).  03/30/20   [provider]  lisinopril-hydrochlorothiazide (PRINZIDE,ZESTORETIC) 20-12.5 MG tablet Take 1 tablet by mouth daily.    [provider]  metFORMIN (GLUCOPHAGE) 500 MG tablet Take 500 mg by mouth every evening.  09/18/19   [provider]  Omega-3 Fatty Acids (FISH OIL) 1000 MG CAPS Take 1,000 mg by mouth in the morning and at bedtime.    [provider]  oxyCODONE-acetaminophen (PERCOCET) 5-325 MG tablet Take 1 tablet by mouth every 4 (four) hours as needed (pain). 05/15/20 05/15/21  Judith Part, MD    Allergies    Poison oak extract  Review of Systems   Review of Systems: All others negative except as noted above in HPI. Specifically, no saddle anesthesia, loss of urine or bowel function.   Physical Exam Updated Vital Signs BP (!) 118/57 (BP Location: Right Wrist)   Pulse (!) 53   Temp 98.5 F (36.9 C) (Oral)   Resp 18   SpO2 92%   Physical Exam  General: Patient appears uncomfortable, in mild acute distress.  Eyes: Sclera non-icteric. No conjunctival injection.  HENT: Moist mucus membranes. No nasal discharge.  Respiratory: Lungs are CTA, bilaterally. No wheezes, rales, or rhonchi. Patient is tachypneic but without increased work of breathing.  Cardiovascular: Regular rate and rhythm. No murmurs, rubs, or gallops. No lower extremity edema. Pulses are 2+ distally in bilateral lower extremities.  Abdominal: Soft and non-tender to palpation.  No rebound or guarding. Neurological: Sensation is grossly intact in bilateral lower extremities to light touch. Strength is 5/5 in RLE and 4/5 in LLE. Unable to perform straight leg raise exam due to active muscle spasms.  Musculoskeletal: There is palpable muscle spasm of the  paraspinal muscles in the lumbar distribution of the spine with gluteal and calf muscle spasms bilaterally. Otherwise, normal muscle bulk without atrophy.  Skin: No lesions. No rashes.   Psych: Normal affect. Normal tone of voice.   ED Results / Procedures / Treatments   Labs (all labs ordered are listed, but only abnormal results are displayed) Labs Reviewed - No data to display  EKG None  Radiology DG Lumbar Spine 2-3 Views  Result Date: 05/14/2020 CLINICAL DATA:  L2-L3 posterior lumbar interbody fusion EXAM: LUMBAR SPINE - 2-3 VIEW; DG C-ARM 1-60 MIN COMPARISON:  None. FINDINGS: Two intraop views were submitted for review of posterior lumbar interbody fusion at L2-L3. Fluoro time 15 seconds IMPRESSION: Posterior lumbar interbody  fusion intraop views Electronically Signed   By: Prudencio Pair M.D.   On: 05/14/2020 20:26   DG Lumbar Spine 2-3 Views  Result Date: 05/14/2020 CLINICAL DATA:  Surgery, elective. Additional history provided: Lumbar 2-3 posterior lumbar interbody fusion and pedicle screw fixation. Provided fluoroscopy time 37 seconds (28.56 mGy). EXAM: LUMBAR SPINE - 2-3 VIEW; DG C-ARM 1-60 MIN COMPARISON:  Lumbar spine MRI 05/04/2020. FINDINGS: PA and lateral view intraoperative fluoroscopic images of the lumbar spine are submitted, 2 images total. The images demonstrate new bilateral pedicle screws at the L2 level. Partially imaged posterior spinal fusion construct more inferiorly within the lumbar spine, beginning at the L3 level. L3-L4 interbody spacer. Overlying retractors. IMPRESSION: Two intraoperative fluoroscopic images of the lumbar spine, as described. Electronically Signed   By: Kellie Simmering DO   On: 05/14/2020 18:57   DG C-Arm 1-60 Min  Result Date: 05/14/2020 CLINICAL DATA:  L2-L3 posterior lumbar interbody fusion EXAM: LUMBAR SPINE - 2-3 VIEW; DG C-ARM 1-60 MIN COMPARISON:  None. FINDINGS: Two intraop views were submitted for review of posterior lumbar interbody fusion  at L2-L3. Fluoro time 15 seconds IMPRESSION: Posterior lumbar interbody fusion intraop views Electronically Signed   By: Prudencio Pair M.D.   On: 05/14/2020 20:26   DG C-Arm 1-60 Min  Result Date: 05/14/2020 CLINICAL DATA:  Surgery, elective. Additional history provided: Lumbar 2-3 posterior lumbar interbody fusion and pedicle screw fixation. Provided fluoroscopy time 37 seconds (28.56 mGy). EXAM: LUMBAR SPINE - 2-3 VIEW; DG C-ARM 1-60 MIN COMPARISON:  Lumbar spine MRI 05/04/2020. FINDINGS: PA and lateral view intraoperative fluoroscopic images of the lumbar spine are submitted, 2 images total. The images demonstrate new bilateral pedicle screws at the L2 level. Partially imaged posterior spinal fusion construct more inferiorly within the lumbar spine, beginning at the L3 level. L3-L4 interbody spacer. Overlying retractors. IMPRESSION: Two intraoperative fluoroscopic images of the lumbar spine, as described. Electronically Signed   By: Kellie Simmering DO   On: 05/14/2020 18:57    Procedures Procedures (including critical care time)  Medications Ordered in ED Medications  methocarbamol (ROBAXIN) tablet 750 mg (750 mg Oral Given 05/11/20 1722)  HYDROmorphone (DILAUDID) injection 1 mg (1 mg Intravenous Given 05/11/20 1722)  diazepam (VALIUM) tablet 5 mg (5 mg Oral Given 05/11/20 1829)  ketorolac (TORADOL) 15 MG/ML injection 15 mg (15 mg Intravenous Given 05/11/20 1829)    ED Course  I have reviewed the triage vital signs and the nursing notes.  Pertinent labs & imaging results that were available during my care of the patient were reviewed by me and considered in my medical decision making (see chart for details).    MDM Rules/Calculators/A&P                          Patient's lumbar and bilateral gluteal / thigh pain with subjective numbness and tingling along the L1 - L2 dermatomes is consistent with disc compression of L1/L2. Patient does have severe pain with diffuse muscle spasms of the  paraspinal muscles and bilateral lower extremities, not on muscle relaxers at home. He endorses numbness and weakness and has 4/5 strength in the left lower extremity, although physical examination is limited secondary to pain and muscle spasm. He has no true numbness with no atrophy on physical exam. No other red flag symptoms of weight loss, night sweats, or bony tenderness to suggest carcinoma, no fevers, chills or history of IVDU to suggest acute infection, and no saddle  anesthesia, urinary, or bowel dysfunction to suggest cauda equina syndrome. Patient states he is scheduled to have surgery with Dr. Christella Noa in 3 days. He states his sugars have been under good control recently. Patient's wife is present who states she will be driving patient home from the hospital.   Plan: Will hold off on imaging given no red flag symptoms and close follow up with Dr. Christella Noa.  - Will give Dilaudid 1g IV for pain - Will give Robaxin 750mg  PO x 1 for muscle spasm - Will reassess   5:45pm Patient sleeping comfortably on his back after receiving dilaudid and robaxin x 1. His wife informed me that his spasms have acutely worsened since Friday, when he was last seen in the office by Dr. Christella Noa. She states that he has had increasing difficulty sleeping and walking due to pain. He has not experienced any falls. His wife states she decided to call EMS after he slid from the cough earlier today in prolonged muscle spasm and she could not get him up from the floor due to pain.   7:10 AM Patient endorses return of muscle spasm upon straightening legs. He has not taken any NSAIDs at home.  - Will give Toradol 15mg  IV once - Will give Valium 5mg  oral once   Patient's pain improved significantly following the above. His wife is able to take him home. Patient is stable for discharge with return precautions provided including: fevers, chills, new numbness, weakness, saddle anesthesia, urinary or bowel changes. Patient expresses  understanding.   Final Clinical Impression(s) / ED Diagnoses Final diagnoses:  Back muscle spasm  Muscle spasms of both lower extremities    Rx / DC Orders ED Discharge Orders         Ordered    diazepam (VALIUM) 5 MG tablet  Every 6 hours PRN        05/11/20 1938         Jeralyn Bennett, MD 05/16/2020, 7:10 AM Pager: [phone]    Jeralyn Bennett, MD 05/16/20 0710    Blanchie Dessert, MD 05/16/20 [phone]

## 2020-05-11 NOTE — Discharge Instructions (Signed)
Jesse Wyatt,  Your muscle spasms in your back and legs are likely due to nerve activation from your spine. You responded very well to pain medications, NSAIDs, and muscle relaxers in the hospital.   I have prescribed Valium 5mg  which you can take every 6 to 8 hours as needed for muscle spasms. For pain, you may take oxycodone 5mg  every 4 to 6 hours as needed for pain. You may increase this to 10mg  if needed. You may also take ibuprofen 400mg  every 6 hours as needed for your pain.   Please return to the ED if you experience fevers, chills, new weakness or numbness in your lower extremities, groin weakness, or alterations in urinary or bowel habits.   Please be sure to follow with your surgeon, Dr. Christella Noa and inform him of your recent ED visit.   Thank you,  Dr. Konrad Penta

## 2020-05-11 NOTE — ED Notes (Signed)
Pt d/c by MD, Pt is provided w/ d.c instructions and follow up care, Pt is out out of The ED in wheel chair with family

## 2020-05-11 NOTE — ED Triage Notes (Signed)
Pt BIB [facility] from home. Pt complains of back pain/spasms. Pt has a history of chronic back pain for 2 years and has had back surgery. Pt usually has spasms that last a few minutes but this spasm has lasted 30+ minutes. Pt took 10mg  of Oxy at home.    Pt is scheduled to have a lumbar decompression fusion on Friday per EMS.  VS in EMS Bp-148/104, Pulse-74, RR-18, O2-96 Temp-98.6 CBG-92

## 2020-05-12 ENCOUNTER — Other Ambulatory Visit
Admission: RE | Admit: 2020-05-12 | Discharge: 2020-05-12 | Disposition: A | Discharge status: Home / Self Care | Payer: Managed Care, Other (non HMO) | Source: Ambulatory Visit | Attending: Neurosurgery | Admitting: Neurosurgery

## 2020-05-12 ENCOUNTER — Other Ambulatory Visit: Payer: Self-pay

## 2020-05-12 DIAGNOSIS — Z01812 Encounter for preprocedural laboratory examination: Secondary | ICD-10-CM | POA: Insufficient documentation

## 2020-05-12 DIAGNOSIS — Z20822 Contact with and (suspected) exposure to covid-19: Secondary | ICD-10-CM | POA: Diagnosis not present

## 2020-05-12 LAB — SARS CORONAVIRUS 2 (TAT 6-24 HRS): SARS Coronavirus 2: NEGATIVE

## 2020-05-13 ENCOUNTER — Encounter (HOSPITAL_COMMUNITY): Payer: Self-pay | Admitting: Neurosurgery

## 2020-05-13 NOTE — Progress Notes (Signed)
Spoke with pt for pre-op call. Pt denies cardiac history. Pt is treated for HTN and type 2 Diabetes. Pt states his most recent A1C was 6.7 four weeks ago. States he does not check his blood sugar at home.  Covid test done 05/12/20 and it's negative. Pt states he's in quarantine since the test was done and will stay in it until he comes to the hospital tomorrow.

## 2020-05-14 ENCOUNTER — Inpatient Hospital Stay (HOSPITAL_COMMUNITY): Payer: No Typology Code available for payment source | Admitting: Anesthesiology

## 2020-05-14 ENCOUNTER — Encounter (HOSPITAL_COMMUNITY): Payer: Self-pay | Admitting: Neurosurgery

## 2020-05-14 ENCOUNTER — Inpatient Hospital Stay (HOSPITAL_COMMUNITY): Payer: No Typology Code available for payment source

## 2020-05-14 ENCOUNTER — Inpatient Hospital Stay (HOSPITAL_COMMUNITY)
Admission: RE | Admit: 2020-05-14 | Discharge: 2020-05-15 | DRG: 460 | Disposition: A | Discharge status: Home / Self Care | Payer: No Typology Code available for payment source | Attending: Neurosurgery | Admitting: Neurosurgery

## 2020-05-14 ENCOUNTER — Other Ambulatory Visit: Payer: Self-pay

## 2020-05-14 ENCOUNTER — Inpatient Hospital Stay (HOSPITAL_COMMUNITY)
Admission: RE | Disposition: A | Discharge status: Home / Self Care | Payer: Self-pay | Source: Home / Self Care | Attending: Neurosurgery

## 2020-05-14 DIAGNOSIS — M62830 Muscle spasm of back: Secondary | ICD-10-CM | POA: Diagnosis present

## 2020-05-14 DIAGNOSIS — E7800 Pure hypercholesterolemia, unspecified: Secondary | ICD-10-CM | POA: Diagnosis present

## 2020-05-14 DIAGNOSIS — Z825 Family history of asthma and other chronic lower respiratory diseases: Secondary | ICD-10-CM | POA: Diagnosis not present

## 2020-05-14 DIAGNOSIS — G4730 Sleep apnea, unspecified: Secondary | ICD-10-CM | POA: Diagnosis present

## 2020-05-14 DIAGNOSIS — Z7984 Long term (current) use of oral hypoglycemic drugs: Secondary | ICD-10-CM

## 2020-05-14 DIAGNOSIS — I10 Essential (primary) hypertension: Secondary | ICD-10-CM | POA: Diagnosis present

## 2020-05-14 DIAGNOSIS — Z823 Family history of stroke: Secondary | ICD-10-CM

## 2020-05-14 DIAGNOSIS — Z8719 Personal history of other diseases of the digestive system: Secondary | ICD-10-CM | POA: Diagnosis not present

## 2020-05-14 DIAGNOSIS — Z833 Family history of diabetes mellitus: Secondary | ICD-10-CM | POA: Diagnosis not present

## 2020-05-14 DIAGNOSIS — Z79899 Other long term (current) drug therapy: Secondary | ICD-10-CM

## 2020-05-14 DIAGNOSIS — M4316 Spondylolisthesis, lumbar region: Secondary | ICD-10-CM | POA: Diagnosis present

## 2020-05-14 DIAGNOSIS — F329 Major depressive disorder, single episode, unspecified: Secondary | ICD-10-CM | POA: Diagnosis present

## 2020-05-14 DIAGNOSIS — Z8042 Family history of malignant neoplasm of prostate: Secondary | ICD-10-CM | POA: Diagnosis not present

## 2020-05-14 DIAGNOSIS — Z981 Arthrodesis status: Secondary | ICD-10-CM | POA: Diagnosis not present

## 2020-05-14 DIAGNOSIS — E119 Type 2 diabetes mellitus without complications: Secondary | ICD-10-CM | POA: Diagnosis present

## 2020-05-14 DIAGNOSIS — Z8249 Family history of ischemic heart disease and other diseases of the circulatory system: Secondary | ICD-10-CM | POA: Diagnosis not present

## 2020-05-14 DIAGNOSIS — Z419 Encounter for procedure for purposes other than remedying health state, unspecified: Secondary | ICD-10-CM

## 2020-05-14 DIAGNOSIS — M48062 Spinal stenosis, lumbar region with neurogenic claudication: Principal | ICD-10-CM | POA: Diagnosis present

## 2020-05-14 HISTORY — DX: Depression, unspecified: F32.A

## 2020-05-14 LAB — BASIC METABOLIC PANEL
Anion gap: 11 (ref 5–15)
BUN: 22 mg/dL — ABNORMAL HIGH (ref 6–20)
CO2: 25 mmol/L (ref 22–32)
Calcium: 9.7 mg/dL (ref 8.9–10.3)
Chloride: 103 mmol/L (ref 98–111)
Creatinine, Ser: 0.98 mg/dL (ref 0.61–1.24)
GFR, Estimated: >60 mL/min (ref >60)
Glucose, Bld: 84 mg/dL (ref 70–99)
Potassium: 4.4 mmol/L (ref 3.5–5.1)
Sodium: 139 mmol/L (ref 135–145)

## 2020-05-14 LAB — HEMOGLOBIN A1C
Hgb A1c MFr Bld: 6.6 % — ABNORMAL HIGH (ref 4.8–5.6)
Mean Plasma Glucose: 142.72 mg/dL

## 2020-05-14 LAB — CBC
HCT: 48.9 % (ref 39.0–52.0)
HCT: 42.1 % (ref 39.0–52.0)
Hemoglobin: 16.1 g/dL (ref 13.0–17.0)
Hemoglobin: 13.7 g/dL (ref 13.0–17.0)
MCH: 27.7 pg (ref 26.0–34.0)
MCH: 27.1 pg (ref 26.0–34.0)
MCHC: 32.9 g/dL (ref 30.0–36.0)
MCHC: 32.5 g/dL (ref 30.0–36.0)
MCV: 84.2 fL (ref 80.0–100.0)
MCV: 83.2 fL (ref 80.0–100.0)
Platelets: 215 10*3/uL (ref 150–400)
Platelets: 192 10*3/uL (ref 150–400)
RBC: 5.81 MIL/uL (ref 4.22–5.81)
RBC: 5.06 MIL/uL (ref 4.22–5.81)
RDW: 12.7 % (ref 11.5–15.5)
RDW: 12.8 % (ref 11.5–15.5)
WBC: 6.9 10*3/uL (ref 4.0–10.5)
WBC: 12.9 10*3/uL — ABNORMAL HIGH (ref 4.0–10.5)
nRBC: 0 % (ref 0.0–0.2)
nRBC: 0 % (ref 0.0–0.2)

## 2020-05-14 LAB — CREATININE, SERUM
Creatinine, Ser: 1.13 mg/dL (ref 0.61–1.24)
GFR, Estimated: >60 mL/min (ref >60)

## 2020-05-14 LAB — TYPE AND SCREEN
ABO/RH(D): A POS
Antibody Screen: NEGATIVE

## 2020-05-14 LAB — SURGICAL PCR SCREEN
MRSA, PCR: NEGATIVE
Staphylococcus aureus: POSITIVE — AB

## 2020-05-14 LAB — GLUCOSE, CAPILLARY
Glucose-Capillary: 102 mg/dL — ABNORMAL HIGH (ref 70–99)
Glucose-Capillary: 104 mg/dL — ABNORMAL HIGH (ref 70–99)
Glucose-Capillary: 74 mg/dL (ref 70–99)
Glucose-Capillary: 74 mg/dL (ref 70–99)
Glucose-Capillary: 89 mg/dL (ref 70–99)

## 2020-05-14 SURGERY — POSTERIOR LUMBAR FUSION 1 LEVEL
Anesthesia: General | Site: Back

## 2020-05-14 MED ORDER — HYDROMORPHONE HCL 1 MG/ML IJ SOLN
INTRAMUSCULAR | Status: AC
  Filled 2020-05-14: qty 1

## 2020-05-14 MED ORDER — VITAMIN D 25 MCG (1000 UNIT) PO TABS
1000.0000 [IU] | ORAL_TABLET | Freq: Every day | ORAL | Status: DC
  Administered 2020-05-15: 1000 [IU] via ORAL
  Filled 2020-05-14: qty 1

## 2020-05-14 MED ORDER — LIDOCAINE 5 % EX PTCH: 1.0000 | MEDICATED_PATCH | Freq: Every day | CUTANEOUS | Status: DC | PRN

## 2020-05-14 MED ORDER — SODIUM CHLORIDE 0.9% FLUSH
3.0000 mL | Freq: Two times a day (BID) | INTRAVENOUS | Status: DC
  Administered 2020-05-14: 3 mL via INTRAVENOUS

## 2020-05-14 MED ORDER — SODIUM CHLORIDE 0.9 % IV SOLN: 250.0000 mL | INTRAVENOUS | Status: DC

## 2020-05-14 MED ORDER — CHLORHEXIDINE GLUCONATE CLOTH 2 % EX PADS
6.0000 | MEDICATED_PAD | Freq: Every day | CUTANEOUS | Status: DC
  Administered 2020-05-15: 6 via TOPICAL

## 2020-05-14 MED ORDER — SUGAMMADEX SODIUM 200 MG/2ML IV SOLN
INTRAVENOUS | Status: DC | PRN
  Administered 2020-05-14: 400 mg via INTRAVENOUS

## 2020-05-14 MED ORDER — ROCURONIUM BROMIDE 10 MG/ML (PF) SYRINGE
PREFILLED_SYRINGE | INTRAVENOUS | Status: DC | PRN
  Administered 2020-05-14: 60 mg via INTRAVENOUS
  Administered 2020-05-14: 20 mg via INTRAVENOUS

## 2020-05-14 MED ORDER — PROPOFOL 10 MG/ML IV BOLUS
INTRAVENOUS | Status: DC | PRN
  Administered 2020-05-14: 200 mg via INTRAVENOUS

## 2020-05-14 MED ORDER — OXYCODONE HCL 5 MG PO TABS
ORAL_TABLET | ORAL | Status: AC
  Filled 2020-05-14: qty 1

## 2020-05-14 MED ORDER — ESCITALOPRAM OXALATE 10 MG PO TABS
20.0000 mg | ORAL_TABLET | Freq: Every evening | ORAL | Status: DC
  Administered 2020-05-14: 20 mg via ORAL
  Filled 2020-05-14: qty 2

## 2020-05-14 MED ORDER — LIDOCAINE 2% (20 MG/ML) 5 ML SYRINGE
INTRAMUSCULAR | Status: DC | PRN
  Administered 2020-05-14: 100 mg via INTRAVENOUS

## 2020-05-14 MED ORDER — CEFAZOLIN SODIUM-DEXTROSE 1-4 GM/50ML-% IV SOLN
1.0000 g | Freq: Three times a day (TID) | INTRAVENOUS | Status: AC
  Administered 2020-05-14 – 2020-05-15 (×2): 1 g via INTRAVENOUS
  Filled 2020-05-14 (×2): qty 50

## 2020-05-14 MED ORDER — THROMBIN 20000 UNITS EX SOLR
CUTANEOUS | Status: AC
  Filled 2020-05-14: qty 20000

## 2020-05-14 MED ORDER — BUPROPION HCL 75 MG PO TABS
75.0000 mg | ORAL_TABLET | Freq: Every morning | ORAL | Status: DC
  Administered 2020-05-15: 75 mg via ORAL
  Filled 2020-05-14: qty 1

## 2020-05-14 MED ORDER — CHLORHEXIDINE GLUCONATE CLOTH 2 % EX PADS: 6.0000 | MEDICATED_PAD | Freq: Once | CUTANEOUS | Status: DC

## 2020-05-14 MED ORDER — PHENOL 1.4 % MT LIQD: 1.0000 | OROMUCOSAL | Status: DC | PRN

## 2020-05-14 MED ORDER — ONDANSETRON HCL 4 MG/2ML IJ SOLN
4.0000 mg | Freq: Four times a day (QID) | INTRAMUSCULAR | Status: DC | PRN
  Filled 2020-05-14: qty 2

## 2020-05-14 MED ORDER — FENTANYL CITRATE (PF) 250 MCG/5ML IJ SOLN
INTRAMUSCULAR | Status: AC
  Filled 2020-05-14: qty 5

## 2020-05-14 MED ORDER — OXYCODONE HCL 5 MG PO TABS
5.0000 mg | ORAL_TABLET | Freq: Once | ORAL | Status: AC | PRN
  Administered 2020-05-14: 5 mg via ORAL

## 2020-05-14 MED ORDER — ACETAMINOPHEN 325 MG PO TABS: 650.0000 mg | ORAL_TABLET | ORAL | Status: DC | PRN

## 2020-05-14 MED ORDER — OXYCODONE HCL 5 MG PO TABS: 10.0000 mg | ORAL_TABLET | ORAL | Status: DC | PRN

## 2020-05-14 MED ORDER — 0.9 % SODIUM CHLORIDE (POUR BTL) OPTIME
TOPICAL | Status: DC | PRN
  Administered 2020-05-14: 1000 mL

## 2020-05-14 MED ORDER — MENTHOL 3 MG MT LOZG: 1.0000 | LOZENGE | OROMUCOSAL | Status: DC | PRN

## 2020-05-14 MED ORDER — KETOROLAC TROMETHAMINE 15 MG/ML IJ SOLN
15.0000 mg | Freq: Four times a day (QID) | INTRAMUSCULAR | Status: DC
  Administered 2020-05-15 (×3): 15 mg via INTRAVENOUS
  Filled 2020-05-14 (×3): qty 1

## 2020-05-14 MED ORDER — THROMBIN 20000 UNITS EX SOLR
CUTANEOUS | Status: DC | PRN
  Administered 2020-05-14: 20 mL via TOPICAL

## 2020-05-14 MED ORDER — ACETAMINOPHEN 650 MG RE SUPP: 650.0000 mg | RECTAL | Status: DC | PRN

## 2020-05-14 MED ORDER — EPHEDRINE SULFATE 50 MG/ML IJ SOLN
INTRAMUSCULAR | Status: DC | PRN
  Administered 2020-05-14: 15 mg via INTRAVENOUS

## 2020-05-14 MED ORDER — SENNOSIDES-DOCUSATE SODIUM 8.6-50 MG PO TABS: 1.0000 | ORAL_TABLET | Freq: Every evening | ORAL | Status: DC | PRN

## 2020-05-14 MED ORDER — LIDOCAINE-EPINEPHRINE 0.5 %-1:200000 IJ SOLN
INTRAMUSCULAR | Status: AC
  Filled 2020-05-14: qty 1

## 2020-05-14 MED ORDER — MIDAZOLAM HCL 2 MG/2ML IJ SOLN
INTRAMUSCULAR | Status: AC
  Filled 2020-05-14: qty 2

## 2020-05-14 MED ORDER — SENNA 8.6 MG PO TABS
1.0000 | ORAL_TABLET | Freq: Two times a day (BID) | ORAL | Status: DC
  Administered 2020-05-14 – 2020-05-15 (×2): 8.6 mg via ORAL
  Filled 2020-05-14 (×2): qty 1

## 2020-05-14 MED ORDER — SUCCINYLCHOLINE CHLORIDE 20 MG/ML IJ SOLN
INTRAMUSCULAR | Status: DC | PRN
  Administered 2020-05-14: 120 mg via INTRAVENOUS

## 2020-05-14 MED ORDER — SODIUM CHLORIDE 0.9% FLUSH: 3.0000 mL | INTRAVENOUS | Status: DC | PRN

## 2020-05-14 MED ORDER — BISACODYL 5 MG PO TBEC: 5.0000 mg | DELAYED_RELEASE_TABLET | Freq: Every day | ORAL | Status: DC | PRN

## 2020-05-14 MED ORDER — INSULIN ASPART 100 UNIT/ML ~~LOC~~ SOLN: 0.0000 [IU] | SUBCUTANEOUS | Status: DC

## 2020-05-14 MED ORDER — MAGNESIUM CITRATE PO SOLN: 1.0000 | Freq: Once | ORAL | Status: DC | PRN

## 2020-05-14 MED ORDER — ATORVASTATIN CALCIUM 10 MG PO TABS
10.0000 mg | ORAL_TABLET | Freq: Every day | ORAL | Status: DC
  Administered 2020-05-14: 10 mg via ORAL
  Filled 2020-05-14: qty 1

## 2020-05-14 MED ORDER — MORPHINE SULFATE (PF) 2 MG/ML IV SOLN: 1.0000 mg | INTRAVENOUS | Status: DC | PRN

## 2020-05-14 MED ORDER — CEFAZOLIN SODIUM-DEXTROSE 2-4 GM/100ML-% IV SOLN
2.0000 g | INTRAVENOUS | Status: AC
  Administered 2020-05-14: 2 g via INTRAVENOUS

## 2020-05-14 MED ORDER — METFORMIN HCL 500 MG PO TABS: 500.0000 mg | ORAL_TABLET | Freq: Every day | ORAL | Status: DC

## 2020-05-14 MED ORDER — LIDOCAINE-EPINEPHRINE 0.5 %-1:200000 IJ SOLN
INTRAMUSCULAR | Status: DC | PRN
  Administered 2020-05-14: 10 mL

## 2020-05-14 MED ORDER — HYDROCHLOROTHIAZIDE 12.5 MG PO CAPS
12.5000 mg | ORAL_CAPSULE | Freq: Every day | ORAL | Status: DC
  Administered 2020-05-15: 12.5 mg via ORAL
  Filled 2020-05-14: qty 1

## 2020-05-14 MED ORDER — HEPARIN SODIUM (PORCINE) 5000 UNIT/ML IJ SOLN
5000.0000 [IU] | Freq: Three times a day (TID) | INTRAMUSCULAR | Status: DC
  Administered 2020-05-15: 5000 [IU] via SUBCUTANEOUS
  Filled 2020-05-14: qty 1

## 2020-05-14 MED ORDER — ONDANSETRON HCL 4 MG/2ML IJ SOLN: 4.0000 mg | Freq: Once | INTRAMUSCULAR | Status: DC | PRN

## 2020-05-14 MED ORDER — MUPIROCIN 2 % EX OINT
1.0000 "application " | TOPICAL_OINTMENT | Freq: Two times a day (BID) | CUTANEOUS | Status: DC
  Administered 2020-05-14 – 2020-05-15 (×2): 1 via NASAL
  Filled 2020-05-14: qty 22

## 2020-05-14 MED ORDER — THROMBIN 20000 UNITS EX KIT
PACK | CUTANEOUS | Status: AC
  Filled 2020-05-14: qty 1

## 2020-05-14 MED ORDER — LACTATED RINGERS IV SOLN: INTRAVENOUS | Status: DC

## 2020-05-14 MED ORDER — FENTANYL CITRATE (PF) 250 MCG/5ML IJ SOLN
INTRAMUSCULAR | Status: DC | PRN
  Administered 2020-05-14: 100 ug via INTRAVENOUS
  Administered 2020-05-14: 50 ug via INTRAVENOUS
  Administered 2020-05-14: 100 ug via INTRAVENOUS

## 2020-05-14 MED ORDER — MIDAZOLAM HCL 5 MG/5ML IJ SOLN
INTRAMUSCULAR | Status: DC | PRN
  Administered 2020-05-14: 2 mg via INTRAVENOUS

## 2020-05-14 MED ORDER — LISINOPRIL 20 MG PO TABS
20.0000 mg | ORAL_TABLET | Freq: Every day | ORAL | Status: DC
  Administered 2020-05-15: 20 mg via ORAL
  Filled 2020-05-14: qty 1

## 2020-05-14 MED ORDER — PHENYLEPHRINE HCL-NACL 10-0.9 MG/250ML-% IV SOLN
INTRAVENOUS | Status: DC | PRN
  Administered 2020-05-14: 20 ug/min via INTRAVENOUS

## 2020-05-14 MED ORDER — ORAL CARE MOUTH RINSE: 15.0000 mL | Freq: Once | OROMUCOSAL | Status: AC

## 2020-05-14 MED ORDER — ONDANSETRON HCL 4 MG PO TABS: 4.0000 mg | ORAL_TABLET | Freq: Four times a day (QID) | ORAL | Status: DC | PRN

## 2020-05-14 MED ORDER — LISINOPRIL-HYDROCHLOROTHIAZIDE 20-12.5 MG PO TABS: 1.0000 | ORAL_TABLET | Freq: Every day | ORAL | Status: DC

## 2020-05-14 MED ORDER — OXYCODONE HCL ER 15 MG PO T12A
15.0000 mg | EXTENDED_RELEASE_TABLET | Freq: Two times a day (BID) | ORAL | Status: DC
  Administered 2020-05-14 – 2020-05-15 (×2): 15 mg via ORAL
  Filled 2020-05-14 (×2): qty 1

## 2020-05-14 MED ORDER — PHENYLEPHRINE 40 MCG/ML (10ML) SYRINGE FOR IV PUSH (FOR BLOOD PRESSURE SUPPORT)
PREFILLED_SYRINGE | INTRAVENOUS | Status: DC | PRN
  Administered 2020-05-14: 80 ug via INTRAVENOUS

## 2020-05-14 MED ORDER — CEFAZOLIN SODIUM-DEXTROSE 2-4 GM/100ML-% IV SOLN
INTRAVENOUS | Status: AC
  Filled 2020-05-14: qty 100

## 2020-05-14 MED ORDER — POTASSIUM CHLORIDE IN NACL 20-0.9 MEQ/L-% IV SOLN
INTRAVENOUS | Status: DC
  Filled 2020-05-14: qty 1000

## 2020-05-14 MED ORDER — CHLORHEXIDINE GLUCONATE 0.12 % MT SOLN
OROMUCOSAL | Status: AC
  Administered 2020-05-14: 15 mL via OROMUCOSAL
  Filled 2020-05-14: qty 15

## 2020-05-14 MED ORDER — CHLORHEXIDINE GLUCONATE 0.12 % MT SOLN: 15.0000 mL | Freq: Once | OROMUCOSAL | Status: AC

## 2020-05-14 MED ORDER — HYDROMORPHONE HCL 1 MG/ML IJ SOLN
0.2500 mg | INTRAMUSCULAR | Status: DC | PRN
  Administered 2020-05-14: 0.5 mg via INTRAVENOUS

## 2020-05-14 MED ORDER — OXYCODONE HCL 5 MG/5ML PO SOLN: 5.0000 mg | Freq: Once | ORAL | Status: AC | PRN

## 2020-05-14 MED ORDER — DEXAMETHASONE SODIUM PHOSPHATE 10 MG/ML IJ SOLN
INTRAMUSCULAR | Status: DC | PRN
  Administered 2020-05-14: 4 mg via INTRAVENOUS

## 2020-05-14 MED ORDER — BUPIVACAINE HCL (PF) 0.5 % IJ SOLN
INTRAMUSCULAR | Status: DC | PRN
  Administered 2020-05-14: 30 mL

## 2020-05-14 MED ORDER — BUPIVACAINE HCL (PF) 0.5 % IJ SOLN
INTRAMUSCULAR | Status: AC
  Filled 2020-05-14: qty 30

## 2020-05-14 MED ORDER — PROPOFOL 10 MG/ML IV BOLUS
INTRAVENOUS | Status: AC
  Filled 2020-05-14: qty 20

## 2020-05-14 MED ORDER — DIAZEPAM 5 MG PO TABS: 5.0000 mg | ORAL_TABLET | Freq: Four times a day (QID) | ORAL | Status: DC | PRN

## 2020-05-14 SURGICAL SUPPLY — 74 items
BENZOIN TINCTURE PRP APPL 2/3 (GAUZE/BANDAGES/DRESSINGS) IMPLANT
BIT DRILL PLIF MAS 5.0MM DISP (DRILL) ×1 IMPLANT
BIT DRILL PLIF MAS DISP 5.5MM (DRILL) ×1 IMPLANT
BLADE CLIPPER SURG (BLADE) ×3 IMPLANT
BUR MATCHSTICK NEURO 3.0 LAGG (BURR) ×3 IMPLANT
BUR PRECISION FLUTE 5.0 (BURR) ×3 IMPLANT
CANISTER SUCT 3000ML PPV (MISCELLANEOUS) ×3 IMPLANT
CAP RELINE MOD TULIP RMM (Cap) ×6 IMPLANT
CARTRIDGE OIL MAESTRO DRILL (MISCELLANEOUS) ×1 IMPLANT
CASCADIA AN CONVEX 8.5X28X11 (Cage) ×6 IMPLANT
CLOSURE WOUND 1/2 X4 (GAUZE/BANDAGES/DRESSINGS)
CNTNR URN SCR LID CUP LEK RST (MISCELLANEOUS) ×1 IMPLANT
CONT SPEC 4OZ STRL OR WHT (MISCELLANEOUS) ×2
COVER BACK TABLE 60X90IN (DRAPES) ×3 IMPLANT
COVER WAND RF STERILE (DRAPES) ×3 IMPLANT
DECANTER SPIKE VIAL GLASS SM (MISCELLANEOUS) ×3 IMPLANT
DERMABOND ADVANCED (GAUZE/BANDAGES/DRESSINGS) ×4
DERMABOND ADVANCED .7 DNX12 (GAUZE/BANDAGES/DRESSINGS) ×2 IMPLANT
DIFFUSER DRILL AIR PNEUMATIC (MISCELLANEOUS) ×3 IMPLANT
DRAPE C-ARM 42X72 X-RAY (DRAPES) ×6 IMPLANT
DRAPE C-ARMOR (DRAPES) ×3 IMPLANT
DRAPE LAPAROTOMY 100X72X124 (DRAPES) ×3 IMPLANT
DRAPE SURG 17X23 STRL (DRAPES) ×3 IMPLANT
DRILL PLIF MAS 5.0MM DISP (DRILL) ×3
DRILL PLIF MAS DISP 5.5MM (DRILL) ×3
DRSG OPSITE POSTOP 4X10 (GAUZE/BANDAGES/DRESSINGS) ×3 IMPLANT
DURAPREP 26ML APPLICATOR (WOUND CARE) ×3 IMPLANT
ELECT REM PT RETURN 9FT ADLT (ELECTROSURGICAL) ×3
ELECTRODE REM PT RTRN 9FT ADLT (ELECTROSURGICAL) ×1 IMPLANT
GAUZE 4X4 16PLY RFD (DISPOSABLE) IMPLANT
GAUZE SPONGE 4X4 12PLY STRL (GAUZE/BANDAGES/DRESSINGS) IMPLANT
GLOVE BIOGEL PI IND STRL 7.5 (GLOVE) ×2 IMPLANT
GLOVE BIOGEL PI IND STRL 8 (GLOVE) ×2 IMPLANT
GLOVE BIOGEL PI INDICATOR 7.5 (GLOVE) ×4
GLOVE BIOGEL PI INDICATOR 8 (GLOVE) ×4
GLOVE ECLIPSE 6.5 STRL STRAW (GLOVE) ×6 IMPLANT
GLOVE ECLIPSE 7.5 STRL STRAW (GLOVE) ×12 IMPLANT
GLOVE ECLIPSE 8.0 STRL XLNG CF (GLOVE) ×3 IMPLANT
GLOVE EXAM NITRILE XL STR (GLOVE) IMPLANT
GLOVE INDICATOR 8.5 STRL (GLOVE) ×3 IMPLANT
GOWN STRL REUS W/ TWL LRG LVL3 (GOWN DISPOSABLE) ×2 IMPLANT
GOWN STRL REUS W/ TWL XL LVL3 (GOWN DISPOSABLE) ×3 IMPLANT
GOWN STRL REUS W/TWL 2XL LVL3 (GOWN DISPOSABLE) IMPLANT
GOWN STRL REUS W/TWL LRG LVL3 (GOWN DISPOSABLE) ×4
GOWN STRL REUS W/TWL XL LVL3 (GOWN DISPOSABLE) ×6
INTERBODY CSCD AN CVX8.5X28X11 (Cage) ×2 IMPLANT
KIT BASIN OR (CUSTOM PROCEDURE TRAY) ×3 IMPLANT
KIT POSITION SURG JACKSON T1 (MISCELLANEOUS) ×3 IMPLANT
KIT TURNOVER KIT B (KITS) ×3 IMPLANT
MILL MEDIUM DISP (BLADE) ×3 IMPLANT
NEEDLE HYPO 25X1 1.5 SAFETY (NEEDLE) ×3 IMPLANT
NEEDLE SPNL 18GX3.5 QUINCKE PK (NEEDLE) IMPLANT
NS IRRIG 1000ML POUR BTL (IV SOLUTION) ×3 IMPLANT
OIL CARTRIDGE MAESTRO DRILL (MISCELLANEOUS) ×3
PACK LAMINECTOMY NEURO (CUSTOM PROCEDURE TRAY) ×3 IMPLANT
PAD ARMBOARD 7.5X6 YLW CONV (MISCELLANEOUS) ×6 IMPLANT
ROD RELINE COCR LORD 5.0X65 (Rod) ×6 IMPLANT
ROD RELINE O-H CON M 5.0/6.0MM (Rod) ×6 IMPLANT
SCREW LOCK RELINE 5.5 TULIP (Screw) ×6 IMPLANT
SCREW LOCK RSS 4.5/5.0MM (Screw) ×6 IMPLANT
SCREW SHANK RELINE MOD 5.0X35 (Screw) ×3 IMPLANT
SCREW SHANK RELINE MOD 5.5X35 (Screw) ×3 IMPLANT
SPONGE LAP 4X18 RFD (DISPOSABLE) IMPLANT
SPONGE SURGIFOAM ABS GEL 100 (HEMOSTASIS) ×3 IMPLANT
STRIP CLOSURE SKIN 1/2X4 (GAUZE/BANDAGES/DRESSINGS) IMPLANT
SUT PROLENE 6 0 BV (SUTURE) IMPLANT
SUT VIC AB 0 CT1 18XCR BRD8 (SUTURE) ×2 IMPLANT
SUT VIC AB 0 CT1 8-18 (SUTURE) ×4
SUT VIC AB 2-0 CT1 18 (SUTURE) ×6 IMPLANT
SUT VIC AB 3-0 SH 8-18 (SUTURE) ×3 IMPLANT
TOWEL GREEN STERILE (TOWEL DISPOSABLE) ×3 IMPLANT
TOWEL GREEN STERILE FF (TOWEL DISPOSABLE) ×3 IMPLANT
TRAY FOLEY MTR SLVR 16FR STAT (SET/KITS/TRAYS/PACK) ×3 IMPLANT
WATER STERILE IRR 1000ML POUR (IV SOLUTION) ×3 IMPLANT

## 2020-05-14 NOTE — Anesthesia Postprocedure Evaluation (Signed)
Anesthesia Post Note  Patient: Jesse Wyatt  Procedure(s) Performed: Lumbar Two-Three  Posterior lumbar interbody fusion and Non Segmental Pedicle Screw Fixation. (N/A Back)     Patient location during evaluation: PACU Anesthesia Type: General Level of consciousness: sedated, patient cooperative and oriented Pain management: pain level controlled Vital Signs Assessment: post-procedure vital signs reviewed and stable Respiratory status: spontaneous breathing, nonlabored ventilation, respiratory function stable and patient connected to nasal cannula oxygen Cardiovascular status: blood pressure returned to baseline and stable Postop Assessment: no apparent nausea or vomiting Anesthetic complications: no   No complications documented.  Last Vitals:  Vitals:   05/14/20 2115 05/14/20 2130  BP: (!) 151/60 133/68  Pulse: 85 82  Resp: 18 16  Temp:  36.6 C  SpO2: 98% 97%    Last Pain:  Vitals:   05/14/20 2130  TempSrc:   PainSc: Asleep                 Mitesh Rosendahl,E. Mycheal [place]

## 2020-05-14 NOTE — Transfer of Care (Signed)
Immediate Anesthesia Transfer of Care Note  Patient: Jesse Wyatt  Procedure(s) Performed: Lumbar Two-Three  Posterior lumbar interbody fusion and Non Segmental Pedicle Screw Fixation. (N/A Back)  Patient Location: PACU  Anesthesia Type:General  Level of Consciousness: drowsy  Airway & Oxygen Therapy: Patient Spontanous Breathing and Patient connected to nasal cannula oxygen  Post-op Assessment: Report given to RN and Post -op Vital signs reviewed and stable  Post vital signs: Reviewed and stable  Last Vitals:  Vitals Value Taken Time  BP 142/81 05/14/20 2048  Temp    Pulse 88 05/14/20 2048  Resp 14 05/14/20 2048  SpO2 97 % 05/14/20 2048  Vitals shown include unvalidated device data.  Last Pain:  Vitals:   05/14/20 1346  TempSrc:   PainSc: 0-No pain      Patients Stated Pain Goal: 3 (83/47/58 3074)  Complications: No complications documented.

## 2020-05-14 NOTE — H&P (Addendum)
BP (!) 117/55   Pulse 60   Temp 98.3 F (36.8 C) (Oral)   Resp 18   Ht 5\' 10"  (1.778 m)   Wt 115.7 kg   SpO2 96%   BMI 36.59 kg/m  Jesse Wyatt returns today to review the MRI of the lumbar spine.  What it shows is that he is now compromised at the L2-3 level with severe stenosis.  Where he has been decompressed looks good, and it was not this bad in April, but it has progressed fairly rapidly since his last operation.  He is in a great deal of pain, barely able to stand or move.  He is 70 inches in height, weighs 256.8 pounds.  BMI is 36.5.  Allergies  Allergen Reactions  . Poison Oak Extract Rash   Past Medical History:  Diagnosis Date  . Chronic back pain   . Chronic headaches    does have photosensitivity. Ibuprofen helps  . Colon polyps    colonoscopy every 5 years  . Depression   . Diabetes mellitus without complication ([place])   . High cholesterol   . Hypertension   . Prediabetes   . PTSD (post-traumatic stress disorder)   . Sleep apnea    uses a cpap   Past Surgical History:  Procedure Laterality Date  . BACK SURGERY     x 2  . CERVICAL FUSION  ~2000  . LAPAROSCOPIC GASTRIC BANDING  ~2010   didn't help  . SHOULDER SURGERY     Family History  Problem Relation Age of Onset  . Stroke Mother   . Hypertension Mother   . Diabetes Mother   . COPD Father   . Atrial fibrillation Father   . Prostate cancer Brother    Social History   Socioeconomic History  . Marital status: Married    Spouse name: Not on file  . Number of children: 1  . Years of education: Not on file  . Highest education level: Not on file  Occupational History  . Occupation: Truck Geophysicist/field seismologist    Comment: long distance  Tobacco Use  . Smoking status: Never Smoker  . Smokeless tobacco: Never Used  Vaping Use  . Vaping Use: Never used  Substance and Sexual Activity  . Alcohol use: Yes    Comment: occ  . Drug use: Never  . Sexual activity: Not on file  Other Topics Concern  . Not on file   Social History Narrative   1 son   Social Determinants of Health   Financial Resource Strain:   . Difficulty of Paying Living Expenses: Not on file  Food Insecurity:   . Worried About Charity fundraiser in the Last Year: Not on file  . Ran Out of Food in the Last Year: Not on file  Transportation Needs:   . Lack of Transportation (Medical): Not on file  . Lack of Transportation (Non-Medical): Not on file  Physical Activity:   . Days of Exercise per Week: Not on file  . Minutes of Exercise per Session: Not on file  Stress:   . Feeling of Stress : Not on file  Social Connections:   . Frequency of Communication with Friends and Family: Not on file  . Frequency of Social Gatherings with Friends and Family: Not on file  . Attends Religious Services: Not on file  . Active Member of Clubs or Organizations: Not on file  . Attends Archivist Meetings: Not on file  . Marital Status: Not  on file  Intimate Partner Violence:   . Fear of Current or Ex-Partner: Not on file  . Emotionally Abused: Not on file  . Physically Abused: Not on file  . Sexually Abused: Not on file   Prior to Admission medications   Medication Sig Start Date End Date Taking? Authorizing Provider  atorvastatin (LIPITOR) 20 MG tablet Take 10 mg by mouth at bedtime.    Yes [provider]  buPROPion (WELLBUTRIN) 75 MG tablet Take 75 mg by mouth every morning. 02/24/20  Yes [provider]  cholecalciferol (VITAMIN D3) 25 MCG (1000 UNIT) tablet Take 1,000 Units by mouth daily.   Yes [provider]  diazepam (VALIUM) 5 MG tablet Take 1 tablet (5 mg total) by mouth every 6 (six) hours as needed for muscle spasms. 05/11/20  Yes Jeralyn Bennett, MD  escitalopram (LEXAPRO) 20 MG tablet Take 20 mg by mouth every evening.    Yes [provider]  ibuprofen (ADVIL,MOTRIN) 400 MG tablet Take 400 mg by mouth 3 (three) times daily as needed for headache or moderate pain.   Yes [provider]  lidocaine (LIDODERM) 5 % Place 1 patch onto the skin daily as needed (pain.).  03/30/20  Yes [provider]  lisinopril-hydrochlorothiazide (PRINZIDE,ZESTORETIC) 20-12.5 MG tablet Take 1 tablet by mouth daily.   Yes [provider]  metFORMIN (GLUCOPHAGE) 500 MG tablet Take 500 mg by mouth every evening.  09/18/19  Yes [provider]  Omega-3 Fatty Acids (FISH OIL) 1000 MG CAPS Take 1,000 mg by mouth in the morning and at bedtime.   Yes [provider]  oxyCODONE (OXY IR/ROXICODONE) 5 MG immediate release tablet Take 5 mg by mouth 4 (four) times daily as needed (pain.).  05/06/20  Yes [provider]     I discussed with Jesse Wyatt what I believe the best option is, and unfortunately, that is surgery.  It would be an extension of his construct to involve L2-3 with interbody cages and pedicle screws, along with rods.  Risks and benefits he is well aware of, having had the operation now.  We will get this scheduled for next week, Wednesday.

## 2020-05-14 NOTE — Anesthesia Procedure Notes (Signed)
Procedure Name: Intubation Date/Time: 05/14/2020 4:24 PM Performed by: Mariea Clonts, CRNA Pre-anesthesia Checklist: Patient identified, Emergency Drugs available, Suction available and Patient being monitored Patient Re-evaluated:Patient Re-evaluated prior to induction Oxygen Delivery Method: Circle System Utilized Preoxygenation: Pre-oxygenation with 100% oxygen Induction Type: IV induction Ventilation: Mask ventilation without difficulty and Oral airway inserted - appropriate to patient size Laryngoscope Size: Glidescope and 4 Grade View: Grade I Tube type: Oral Tube size: 8.0 mm Number of attempts: 1 Airway Equipment and Method: Stylet and Oral airway Placement Confirmation: ETT inserted through vocal cords under direct vision,  positive ETCO2 and breath sounds checked- equal and bilateral Tube secured with: Tape Dental Injury: Teeth and Oropharynx as per pre-operative assessment

## 2020-05-14 NOTE — Anesthesia Preprocedure Evaluation (Addendum)
Anesthesia Evaluation  Patient identified by MRN, date of birth, ID band Patient awake  General Assessment Comment:Hx/o difficult intubation years ago. No difficulty with recent back surgery ( Nov. 2020 ). Limited ROM cervical spine. Glidescope intubation without difficulty last procedure   Reviewed: Patient's Chart, lab work & pertinent test results  History of Anesthesia Complications (+) DIFFICULT AIRWAY  Airway Mallampati: III  TM Distance: >3 FB Neck ROM: Limited    Dental  (+) Teeth Intact   Pulmonary sleep apnea and Continuous Positive Airway Pressure Ventilation ,    Pulmonary exam normal        Cardiovascular hypertension, Pt. on medications  Rhythm:Regular Rate:Normal     Neuro/Psych  Headaches, Anxiety Depression PTSD   GI/Hepatic negative GI ROS, Neg liver ROS,   Endo/Other  diabetes, Well Controlled, Type 2, Oral Hypoglycemic Agents  Renal/GU negative Renal ROS  negative genitourinary   Musculoskeletal  (+) Arthritis , Spinal stenosis with neurogenic claudication   Abdominal (+)  Abdomen: soft. Bowel sounds: normal.  Peds  Hematology   Anesthesia Other Findings   Reproductive/Obstetrics                           Anesthesia Physical Anesthesia Plan  ASA: II  Anesthesia Plan: General   Post-op Pain Management:    Induction: Intravenous  PONV Risk Score and Plan: 2 and Ondansetron, Dexamethasone, Midazolam and Treatment may vary due to age or medical condition  Airway Management Planned: Mask and Oral ETT  Additional Equipment: None  Intra-op Plan:   Post-operative Plan: Extubation in OR  Informed Consent: I have reviewed the patients History and Physical, chart, labs and discussed the procedure including the risks, benefits and alternatives for the proposed anesthesia with the patient or authorized representative who has indicated his/her understanding and  acceptance.     Dental advisory given  Plan Discussed with: CRNA  Anesthesia Plan Comments: (Lab Results      Component                Value               Date                      WBC                      7.8                 11/28/2019                HGB                      15.5                11/28/2019                HCT                      46.6                11/28/2019                MCV                      85.8                11/28/2019  PLT                      210                 11/28/2019           Lab Results      Component                Value               Date                      NA                       138                 11/28/2019                K                        4.1                 11/28/2019                CO2                      24                  11/28/2019                GLUCOSE                  227 (H)             11/28/2019                BUN                      17                  11/28/2019                CREATININE               1.05                11/28/2019                CALCIUM                  9.3                 11/28/2019                GFRNONAA                 >60                 11/28/2019                GFRAA                    >60                 11/28/2019          )        Anesthesia Quick Evaluation

## 2020-05-15 LAB — GLUCOSE, CAPILLARY
Glucose-Capillary: 89 mg/dL (ref 70–99)
Glucose-Capillary: 99 mg/dL (ref 70–99)
Glucose-Capillary: 111 mg/dL — ABNORMAL HIGH (ref 70–99)

## 2020-05-15 MED ORDER — OXYCODONE-ACETAMINOPHEN 5-325 MG PO TABS
1.0000 | ORAL_TABLET | ORAL | 0 refills | Status: AC | PRN
Start: 2020-05-15 — End: 2021-05-15

## 2020-05-15 NOTE — Evaluation (Signed)
Occupational Therapy Evaluation Patient Details Name: Jesse Wyatt MRN: [id number] DOB: Nov 29, 1960 Today's Date: 05/15/2020    History of Present Illness Pt is a 59 y/o male now s/p PLIF L2-3 and pedicle screw fixation on 11/5. PMHx includes DM, PTSD, HTN previous spinal surgery (x2).    Clinical Impression   This 59 y/o male presents with the above. PTA pt reports typically independent with ADL, iADL and mobility tasks, though reports spouse assisting with some ADL approx 1 month leading up to surgery due to increased pain. Today pt performing room level mobility without AD at minguard assist level (progressing to supervision), requiring up to minA for bed mobility and LB ADL. Educated pt re: back precautions, safety and compensatory techniques for completing ADL and functional transfers with pt verbalizing and return demonstrating understanding given min verbal cues. Pt to benefit from continued acute OT services to maximize his safety and independence with ADL and mobility prior to return home with family assist. Do not anticipate he'll require follow up OT services after discharge.     Follow Up Recommendations  No OT follow up;Supervision - Intermittent    Equipment Recommendations  None recommended by OT           Precautions / Restrictions Precautions Precautions: Fall;Back Precaution Booklet Issued: Yes (comment) Precaution Comments: issued and reviewed throughout Required Braces or Orthoses:  ("no brace needed" order) Restrictions Weight Bearing Restrictions: No      Mobility Bed Mobility Overal bed mobility: Needs Assistance Bed Mobility: Rolling;Sidelying to Sit Rolling: Min guard Sidelying to sit: Min assist       General bed mobility comments: assist for trunk elevation, min VCs for sequencing through log roll     Transfers Overall transfer level: Needs assistance Equipment used: None Transfers: Sit to/from Stand Sit to Stand: Min guard          General transfer comment: for balance and safety, no physical assist required, stood x2 from EOB     Balance Overall balance assessment: Mild deficits observed, not formally tested                                         ADL either performed or assessed with clinical judgement   ADL Overall ADL's : Needs assistance/impaired Eating/Feeding: Independent;Sitting   Grooming: Oral care;Wash/dry hands;Supervision/safety;Standing   Upper Body Bathing: Supervision/ safety;Sitting   Lower Body Bathing: Min guard;Sit to/from stand   Upper Body Dressing : Set up;Sitting   Lower Body Dressing: Sit to/from stand;Minimal assistance Lower Body Dressing Details (indicate cue type and reason): some assist for sock management, pt able to don underwear at minguard assist level via modified figure 4 Toilet Transfer: Min guard;Ambulation Toilet Transfer Details (indicate cue type and reason): simulated via transfer to recliner, room level mobility; minguard progressed to supervision  Toileting- Clothing Manipulation and Hygiene: Min guard;Sit to/from stand       Functional mobility during ADLs: Min guard (progressed to supervision) General ADL Comments: min cues for adhering to spinal precautions                          Pertinent Vitals/Pain Pain Assessment: Faces Faces Pain Scale: Hurts little more Pain Location: incisional, reports LLE/hip pain have improved since surgery Pain Descriptors / Indicators: Discomfort;Guarding;Sore Pain Intervention(s): Monitored during session;Repositioned     Hand Dominance Right   Extremity/Trunk Assessment  Upper Extremity Assessment Upper Extremity Assessment: Overall WFL for tasks assessed   Lower Extremity Assessment Lower Extremity Assessment: Defer to PT evaluation   Cervical / Trunk Assessment Cervical / Trunk Assessment: Other exceptions Cervical / Trunk Exceptions: s/p spinal surgery   Communication  Communication Communication: No difficulties   Cognition Arousal/Alertness: Awake/alert Behavior During Therapy: WFL for tasks assessed/performed Overall Cognitive Status: Within Functional Limits for tasks assessed                                     General Comments       Exercises     Shoulder Instructions      Home Living Family/patient expects to be discharged to:: Private residence Living Arrangements: Spouse/significant other;Children (two adult aged sons) Available Help at Discharge: Family Type of Home: House Home Access: Ramped entrance;Stairs to enter     Home Layout: One level     Bathroom Shower/Tub: Occupational psychologist: Standard     Home Equipment: Shower seat - built in          Prior Functioning/Environment Level of Independence: Independent;Needs assistance    ADL's / Homemaking Assistance Needed: typically independent however reports needing some assist for ADL given significant increase in pain over the last month leading up to sx   Comments: long distance truck driver         OT Problem List: Decreased range of motion;Decreased knowledge of precautions;Decreased knowledge of use of DME or AE;Pain;Decreased activity tolerance      OT Treatment/Interventions: Self-care/ADL training;Therapeutic exercise;DME and/or AE instruction;Therapeutic activities;Patient/family education;Visual/perceptual remediation/compensation    OT Goals(Current goals can be found in the care plan section) Acute Rehab OT Goals Patient Stated Goal: home, less pain  OT Goal Formulation: With patient Time For Goal Achievement: 05/29/20 Potential to Achieve Goals: Good  OT Frequency: Min 2X/week   Barriers to D/C:            Co-evaluation              AM-PAC OT "6 Clicks" Daily Activity     Outcome Measure Help from another person eating meals?: None Help from another person taking care of personal grooming?: A Little Help  from another person toileting, which includes using toliet, bedpan, or urinal?: A Little Help from another person bathing (including washing, rinsing, drying)?: A Little Help from another person to put on and taking off regular upper body clothing?: None Help from another person to put on and taking off regular lower body clothing?: A Little 6 Click Score: 20   End of Session Equipment Utilized During Treatment: Gait belt Nurse Communication: Mobility status  Activity Tolerance: Patient tolerated treatment well Patient left: in chair;with call bell/phone within reach  OT Visit Diagnosis: Other abnormalities of gait and mobility (R26.89);Pain Pain - part of body:  (back)                Time: 7858-8502 OT Time Calculation (min): 24 min Charges:  OT General Charges $OT Visit: 1 Visit OT Evaluation $OT Eval Low Complexity: 1 Low OT Treatments $Self Care/Home Management : 8-22 mins  Lou Cal, OT Acute Rehabilitation Services Pager [phone] Office [phone]  Raymondo Band 05/15/2020, 9:08 AM

## 2020-05-15 NOTE — Evaluation (Signed)
Physical Therapy Evaluation Patient Details Name: Jesse Wyatt MRN: [id number] 3DOB: May 19, 1961 Today's Date: 05/15/2020   History of Present Illness  Pt is a 59 y/o male now s/p PLIF L2-3 and pedicle screw fixation on 11/5. PMHx includes DM, PTSD, HTN previous spinal surgery (x2).   Clinical Impression  Pt is at or close to baseline functioning and should be safe at home with available assist. There are no further acute PT needs.  Will sign off at this time.     Follow Up Recommendations No PT follow up    Equipment Recommendations  None recommended by PT    Recommendations for Other Services       Precautions / Restrictions Precautions Precautions: Fall;Back Precaution Booklet Issued: Yes (comment) Precaution Comments: issued and reviewed throughout Required Braces or Orthoses:  ("no brace needed" order)      Mobility  Bed Mobility               General bed mobility comments: up OOB    Transfers Overall transfer level: Needs assistance Equipment used: None Transfers: Sit to/from Stand Sit to Stand: Supervision         General transfer comment: safe technique  Ambulation/Gait Ambulation/Gait assistance: Supervision Gait Distance (Feet): 400 Feet Assistive device: None Gait Pattern/deviations: Step-through pattern   Gait velocity interpretation: >2.62 ft/sec, indicative of community ambulatory General Gait Details: safe, steady, mobilizing at age appropriate speeds.  No deviation with abrupt speed directional changes  Stairs Stairs: Yes Stairs assistance: Supervision Stair Management: One rail Right;Alternating pattern;Forwards Number of Stairs: 12 General stair comments: safe with rail  Wheelchair Mobility    Modified Rankin (Stroke Patients Only)       Balance Overall balance assessment: No apparent balance deficits (not formally assessed)                                           Pertinent Vitals/Pain Faces Pain  Scale: Hurts little more Pain Location: incisional, reports LLE/hip pain have improved since surgery Pain Descriptors / Indicators: Discomfort;Guarding;Sore    Home Living Family/patient expects to be discharged to:: Private residence Living Arrangements: Spouse/significant other;Children (two adult aged sons) Available Help at Discharge: Family Type of Home: House Home Access: Ramped entrance;Stairs to enter     Home Layout: One level Home Equipment: Shower seat - built in      Prior Function Level of Independence: Independent;Needs assistance      ADL's / Homemaking Assistance Needed: typically independent however reports needing some assist for ADL given significant increase in pain over the last month leading up to sx  Comments: long distance truck driver      Hand Dominance   Dominant Hand: Right    Extremity/Trunk Assessment   Upper Extremity Assessment Upper Extremity Assessment: Overall WFL for tasks assessed    Lower Extremity Assessment Lower Extremity Assessment: Overall WFL for tasks assessed    Cervical / Trunk Assessment Cervical / Trunk Assessment: Other exceptions Cervical / Trunk Exceptions: s/p spinal surgery  Communication   Communication: No difficulties  Cognition Arousal/Alertness: Awake/alert Behavior During Therapy: WFL for tasks assessed/performed Overall Cognitive Status: Within Functional Limits for tasks assessed                                        General  Comments General comments (skin integrity, edema, etc.): Reinforced all back care/prec, transition side to/from sitting, lifting restrictions and progression of activity post d/c.    Exercises     Assessment/Plan    PT Assessment Patent does not need any further PT services  PT Problem List         PT Treatment Interventions      PT Goals (Current goals can be found in the Care Plan section)  Acute Rehab PT Goals Patient Stated Goal: home, less pain   PT Goal Formulation: All assessment and education complete, DC therapy    Frequency     Barriers to discharge        Co-evaluation               AM-PAC PT "6 Clicks" Mobility  Outcome Measure Help needed turning from your back to your side while in a flat bed without using bedrails?: A Little Help needed moving from lying on your back to sitting on the side of a flat bed without using bedrails?: A Little Help needed moving to and from a bed to a chair (including a wheelchair)?: None Help needed standing up from a chair using your arms (e.g., wheelchair or bedside chair)?: None Help needed to walk in hospital room?: None Help needed climbing 3-5 steps with a railing? : None 6 Click Score: 22    End of Session   Activity Tolerance: Patient tolerated treatment well Patient left: Other (comment) (up with RN) Nurse Communication: Mobility status PT Visit Diagnosis: Other abnormalities of gait and mobility (R26.89)    Time: 8657-8469 PT Time Calculation (min) (ACUTE ONLY): 17 min   Charges:   PT Evaluation $PT Eval Low Complexity: 1 Low          05/15/2020  Ginger Carne., PT Acute Rehabilitation Services [phone]  (pager) [phone]  (office)  Tessie Fass Vonn Sliger 05/15/2020, 12:48 PM

## 2020-05-15 NOTE — Progress Notes (Signed)
Neurosurgery Service Progress Note  Subjective: No acute events overnight, back / leg pain and numbness significantly improved from preop   Objective: Vitals:   05/15/20 0446 05/15/20 0736 05/15/20 1050 05/15/20 1132  BP:  111/60 106/63 (!) 115/52  Pulse:  (!) 59 70 70  Resp:  12 16 18   Temp: 98.4 F (36.9 C) 98.2 F (36.8 C)  98.7 F (37.1 C)  TempSrc:  Oral  Oral  SpO2:   96%   Weight:      Height:        Physical Exam: Strength 5/5 x4, SILTx4, incision c/d/i  Assessment & Plan: 59 y.o. man s/p extension of lumbar construct, Sx remarkably better post-op, pt is very happy w/ his result, would like to go home today, ambulating w/o issue, good po intake.  -discharge home  Judith Part  05/15/20 11:52 AM

## 2020-05-15 NOTE — Discharge Summary (Signed)
Discharge Summary  Date of Admission: 05/14/2020  Date of Discharge: 05/15/20  Attending Physician: Ashok Pall MD  Hospital Course: Patient was admitted following an uncomplicated G1-8 PLIF and extension of construct. He was recovered in PACU and transferred to 4NP. Post-op, his preop back pain and radicular symptoms were remarkably better, his hospital course was uncomplicated and the patient was discharged home on 05/15/20. He will follow up in clinic with Dr. Christella Noa in 2 weeks.  Neurologic exam at discharge:  Strength 5/5 x4, SILTx4  Discharge diagnosis: Lumbar radiculopathy  Judith Part, MD 05/15/20 11:55 AM

## 2020-05-15 NOTE — Discharge Instructions (Addendum)
Discharge Instructions  Slowly increase your activity back to normal, avoid lifting more than 20 pounds until discussed at follow up with Dr. Christella Noa.   Okay to shower on the day of discharge. Use regular soap and water and try to be gentle when cleaning your incision. You can remove your dressing from the incision on 05/16/20. In the first few days after surgery, there may be some bloody drainage from your wound. If this happens, you can cover your incision with a gauze dressing to prevent it from staining your clothes or bed linens. If your incision begins to itch, rub some bacitracin or neosporin ointment on it instead of scratching it.  Follow up with Dr. Christella Noa in 2 weeks after discharge. If you do not already have a discharge appointment, please call his office at [phone] to schedule a follow up appointment. If you have any concerns or questions, please call the office and let us know.

## 2020-05-17 MED FILL — Thrombin For Soln 20000 Unit: CUTANEOUS | Qty: 1 | Status: AC

## 2020-05-17 MED FILL — Thrombin For Soln Kit 20000 Unit: CUTANEOUS | Qty: 1 | Status: AC

## 2020-05-22 NOTE — Op Note (Signed)
05/14/2020  1:11 PM  PATIENT:  Jesse Wyatt  58 y.o. 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  BLOOD ADMINISTERED:none  CELL SAVER GIVEN:none  COUNT:per nursing  DRAINS: none   SPECIMEN:  No Specimen  DICTATION: Jesse Wyatt is a 59 y.o. male whom was taken to the operating room intubated, and placed under a general anesthetic without difficulty. A foley catheter was placed under sterile conditions. He was positioned prone on a Jackson table with all pressure points properly padded.  His lumbar region was prepped and draped in a sterile manner. I infiltrated 10cc's 1/2%lidocaine/1:2000,000 strength epinephrine into the planned incision. I opened the skin with a 10 blade and took the incision down to the thoracolumbar fascia. I exposed the lamina of L2 in a subperiosteal fashion bilaterally. I confirmed my location by exposing the .  I placed self retaining retractors and started the decompression.  I decompressed the spinal canal via a near complete laminectomy of L2. I used the drill and Kerrison punches to remove bone and ligament. This allowed for decompression of the spinal canal, lateral recesses and the neural foramina at L2/3. I completed an inferior facetectomy of L2 bilaterally , and partial superior facetectomy of L3 bilaterally. This decompression was well beyond the needed or necessary exposure for  a PLIF.  PLIF's were performed at L2/3 in the same fashion. I opened the disc space with a 15 blade then used a variety of instruments to remove the disc and prepare the space for the arthrodesis. I used curettes, rongeurs, punches, shavers for the disc space, and rasps in the discetomy. I measured the disc space and placed Stryker titanium cages(stryker) into the disc space(s). The cages were packed with autograft morsels. We placed pedicle screws at L2, using fluoroscopic guidance. I drilled a pilot hole, then cannulated the pedicle with a drill at each site. I then tapped each pedicle, assessing each site for pedicle violations. No cutouts were appreciated. Screws (Nuvasive relign) were then placed at each site without difficulty. We attached rods and locking caps with the appropriate tools. The locking caps were secured with torque limited screwdrivers. Final films were performed and the final construct appeared to be in good position.  We closed the wound in a layered fashion. We approximated the thoracolumbar fascia, subcutaneous, and subcuticular planes with vicryl sutures. I used Dermabond and an occlusive bandage for a sterile dressing.     PLAN OF CARE: Admit to inpatient   PATIENT DISPOSITION:  PACU - hemodynamically stable.   Delay start of Pharmacological VTE agent (>24hrs) due to surgical blood loss or risk of bleeding:  yes

## 2020-08-25 IMAGING — DX DG LUMBAR SPINE 2-3V
2 series · 2 of 2 positions shown · non-contrast
Comparison: December 01, 2019 intraoperative radiograph; lumbar MRI
October 27, 2019

CLINICAL DATA: Spinal stenosis

EXAM:
LUMBAR SPINE - 2-3 VIEW

[l-spine ap]
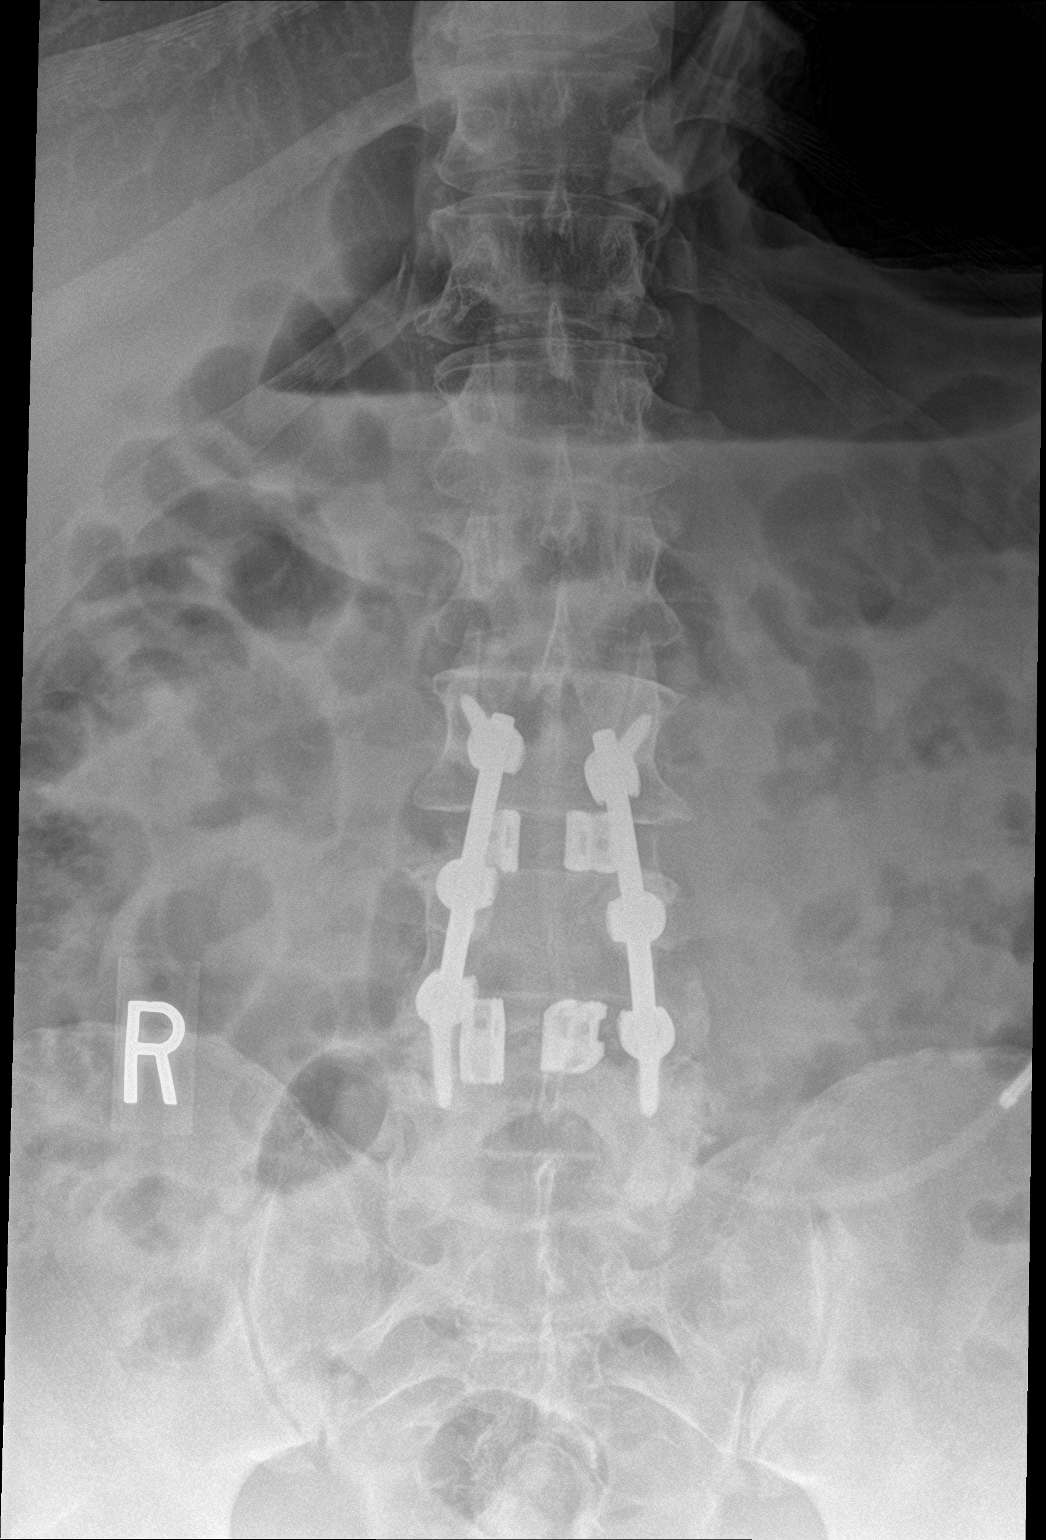

[l-spine lat]
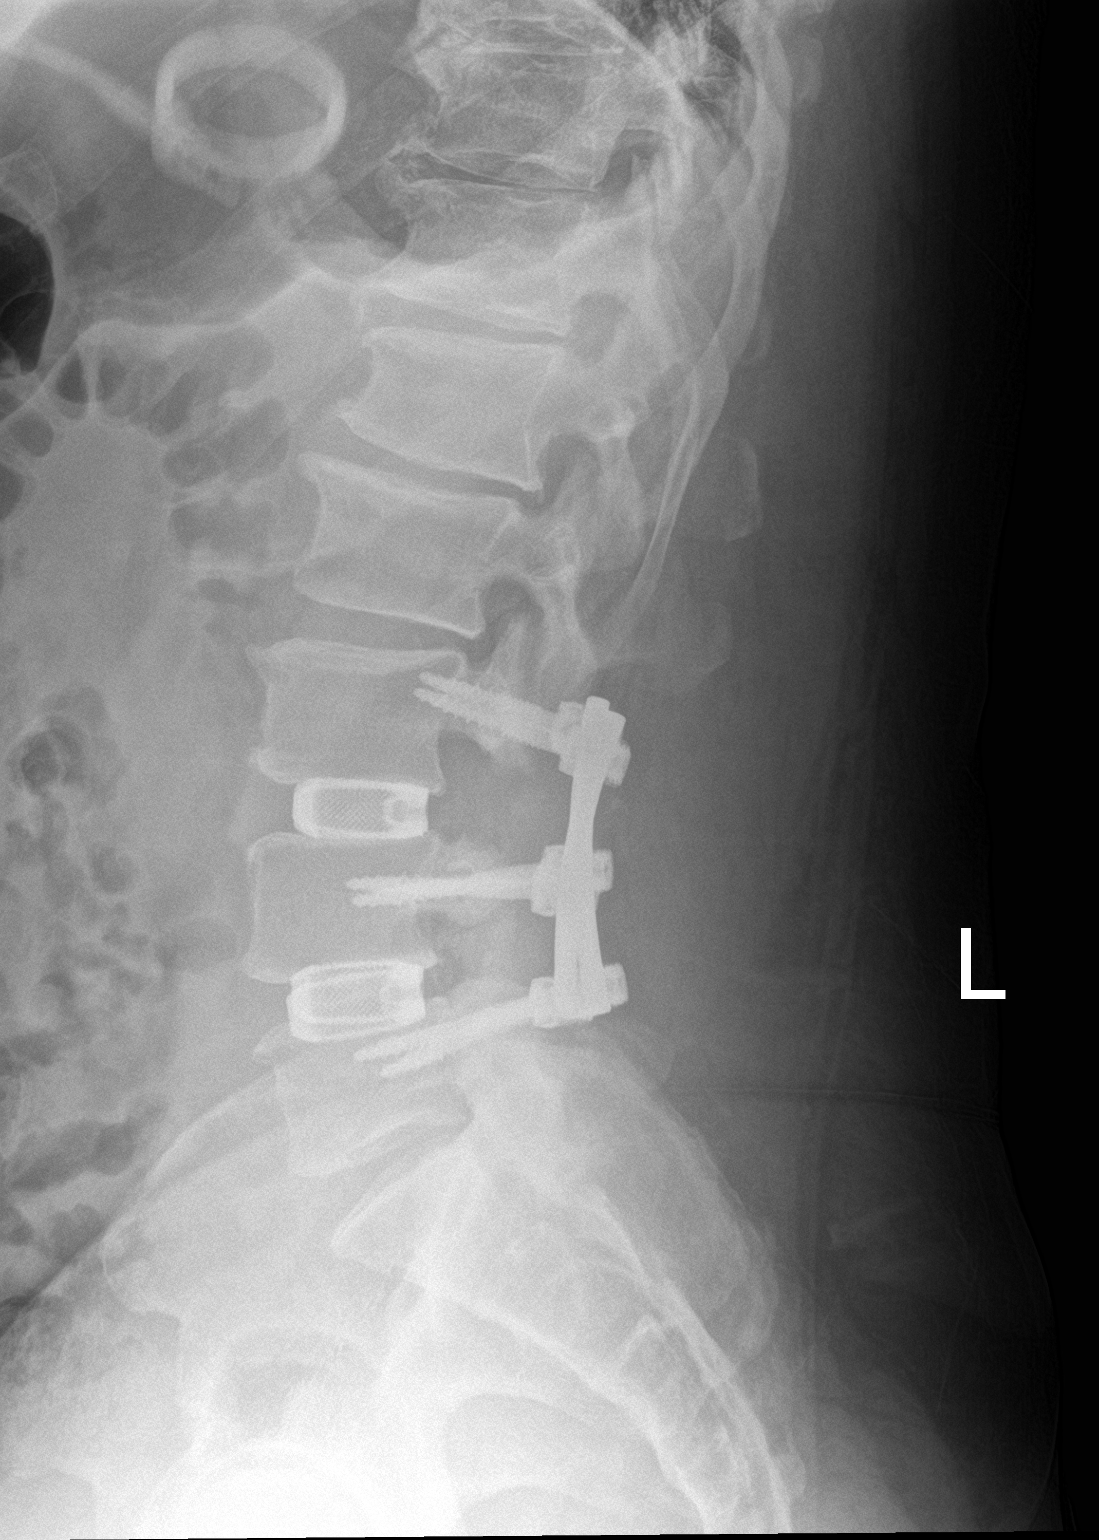

[2 of 2 positions shown; findings below may reference images not displayed]

FINDINGS: Frontal and lateral views were obtained. Patient is status post
posterior screw and plate fixation at L3, L4, and L5 with disc
spacers at L3-4 and L4-5. Pedicle screws are positioned in the
respective vertebral bodies at each level. No fracture or
spondylolisthesis. There is moderate disc space narrowing at T12-L1
and L1-2. Other disc spaces appear unremarkable with disc spacers
present at L3-4 and L4-5. No erosive change
IMPRESSION: Postoperative change with posterior screw and plate fixation from
L3-L5 with disc spacers at L3-4 and L4-5. Support hardware appears
intact. Disc space narrowing noted at T12-L1 and L1-2. No fracture
or spondylolisthesis.

## 2021-01-15 IMAGING — RF DG C-ARM 1-60 MIN
1 series · 2 of 2 positions shown · non-contrast
Comparison: None.

CLINICAL DATA: L2-L3 posterior lumbar interbody fusion

EXAM:
LUMBAR SPINE - 2-3 VIEW; DG C-ARM 1-60 MIN

[Series 1: run · 2 of 2 slices shown]
[im 1/2]
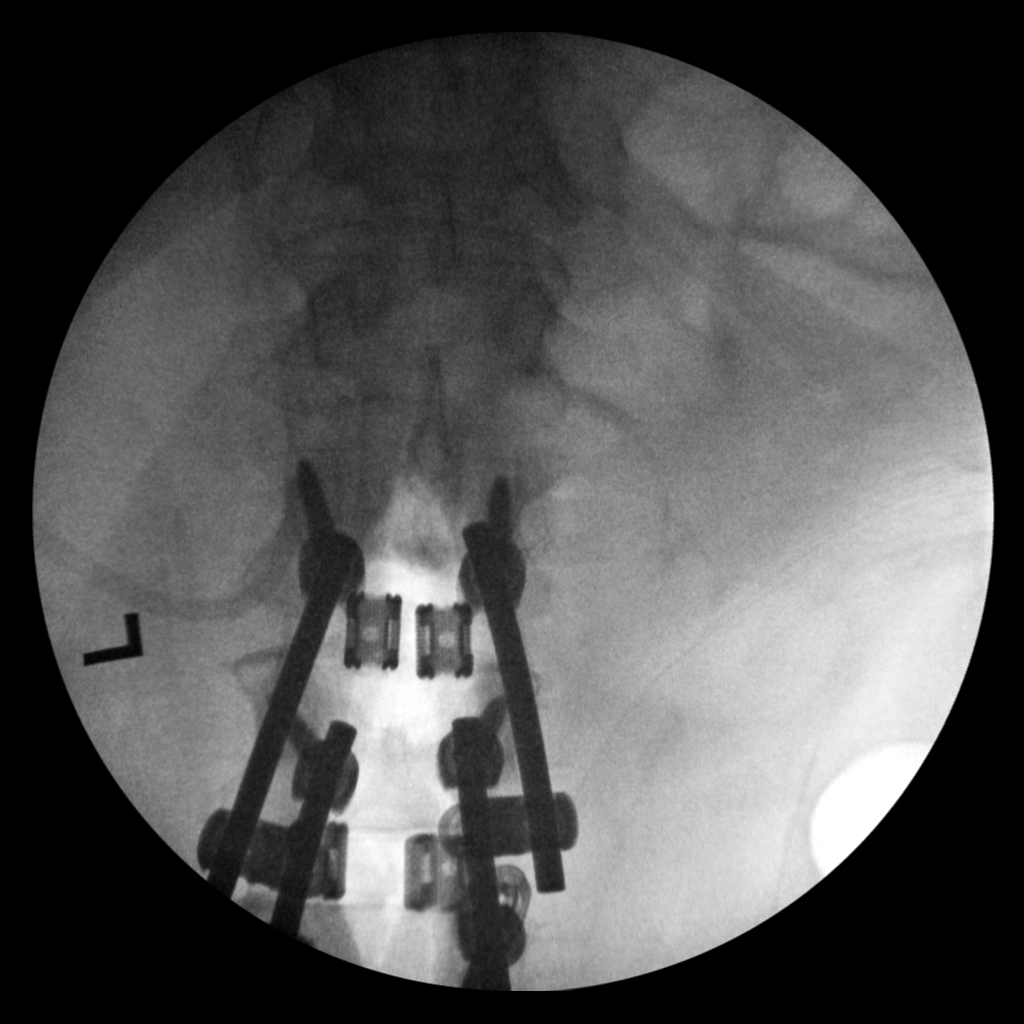
[im 2/2]
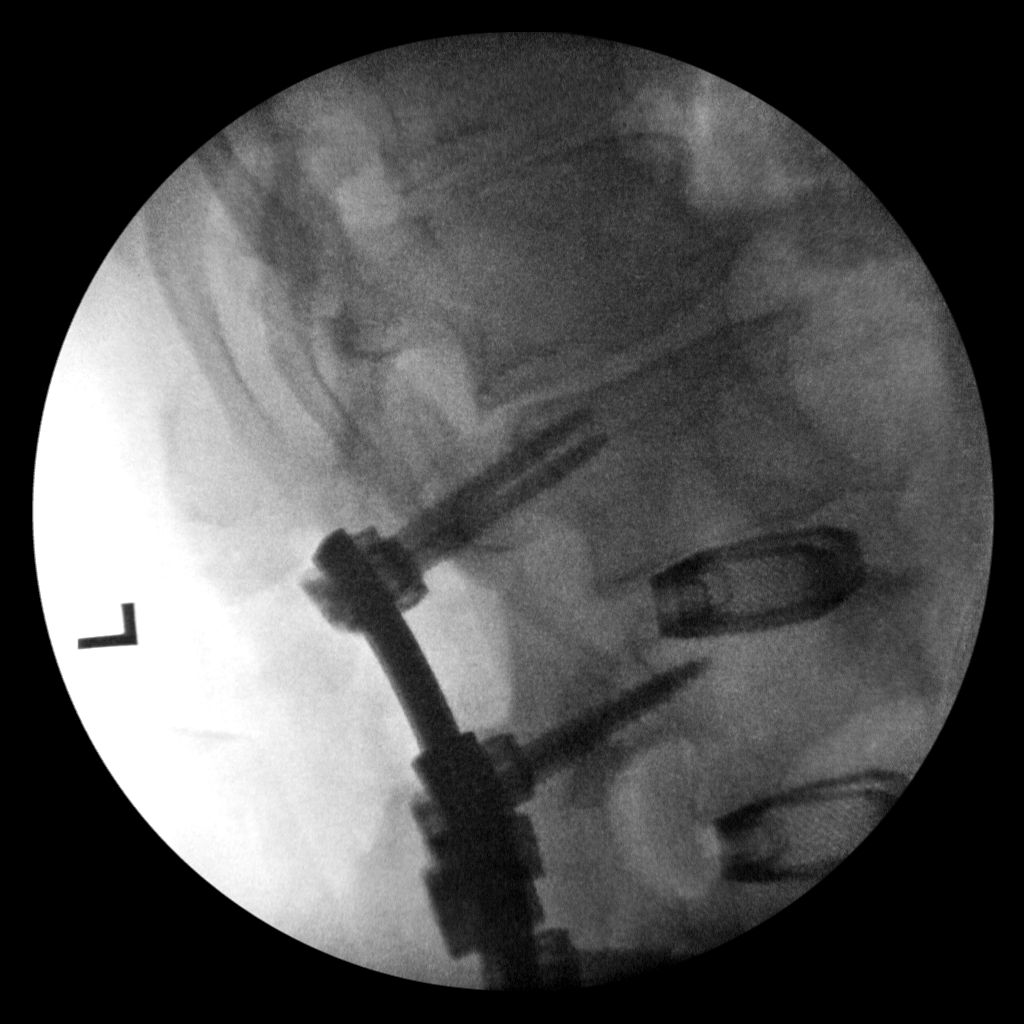

[2 of 2 positions shown; findings below may reference images not displayed]

FINDINGS: Two intraop views were submitted for review of posterior lumbar
interbody fusion at L2-L3. Fluoro time 15 seconds
IMPRESSION: Posterior lumbar interbody fusion intraop views
